# Patient Record
Sex: Male | Born: 1985 | Race: White | Hispanic: No | Marital: Married | Smoking: Former smoker
Health system: Southern US, Community
[De-identification: ages and names within clinical notes are randomized; demographics above are authoritative.]

## PROBLEM LIST (undated history)

## (undated) DIAGNOSIS — M9918 Subluxation complex (vertebral) of rib cage: Secondary | ICD-10-CM

## (undated) DIAGNOSIS — F419 Anxiety disorder, unspecified: Secondary | ICD-10-CM

## (undated) HISTORY — DX: Subluxation complex (vertebral) of rib cage: M99.18

## (undated) HISTORY — DX: Anxiety disorder, unspecified: F41.9

## (undated) HISTORY — PX: PSEUDOANEURYSM REPAIR: SHX2272

## (undated) HISTORY — PX: WISDOM TOOTH EXTRACTION: SHX21

---

## 2003-12-08 ENCOUNTER — Encounter: Admission: RE | Admit: 2003-12-08 | Discharge: 2003-12-08 | Payer: Self-pay | Admitting: Sports Medicine

## 2004-02-26 ENCOUNTER — Ambulatory Visit: Payer: Self-pay | Admitting: Dermatology

## 2009-05-17 ENCOUNTER — Emergency Department: Payer: Self-pay | Admitting: Emergency Medicine

## 2010-10-15 ENCOUNTER — Other Ambulatory Visit: Payer: Self-pay | Admitting: Internal Medicine

## 2010-10-15 MED ORDER — VALACYCLOVIR HCL 500 MG PO TABS: 500.0000 mg | ORAL_TABLET | Freq: Every day | ORAL | Status: AC

## 2010-10-19 ENCOUNTER — Other Ambulatory Visit: Payer: Self-pay | Admitting: Internal Medicine

## 2013-02-28 DIAGNOSIS — G2589 Other specified extrapyramidal and movement disorders: Secondary | ICD-10-CM | POA: Insufficient documentation

## 2016-10-31 DIAGNOSIS — B002 Herpesviral gingivostomatitis and pharyngotonsillitis: Secondary | ICD-10-CM | POA: Insufficient documentation

## 2016-10-31 DIAGNOSIS — Z6831 Body mass index (BMI) 31.0-31.9, adult: Secondary | ICD-10-CM | POA: Insufficient documentation

## 2019-10-14 ENCOUNTER — Other Ambulatory Visit: Payer: Self-pay

## 2019-10-14 ENCOUNTER — Other Ambulatory Visit: Payer: Self-pay | Admitting: *Deleted

## 2019-10-14 DIAGNOSIS — Z20822 Contact with and (suspected) exposure to covid-19: Secondary | ICD-10-CM

## 2019-10-16 LAB — NOVEL CORONAVIRUS, NAA: SARS-CoV-2, NAA: NOT DETECTED

## 2019-10-16 LAB — SARS-COV-2, NAA 2 DAY TAT

## 2020-03-16 DIAGNOSIS — M13812 Other specified arthritis, left shoulder: Secondary | ICD-10-CM | POA: Diagnosis not present

## 2020-05-12 DIAGNOSIS — M13812 Other specified arthritis, left shoulder: Secondary | ICD-10-CM | POA: Diagnosis not present

## 2020-10-13 ENCOUNTER — Encounter: Payer: Self-pay | Admitting: Family Medicine

## 2020-10-13 ENCOUNTER — Ambulatory Visit (INDEPENDENT_AMBULATORY_CARE_PROVIDER_SITE_OTHER): Admitting: Family Medicine

## 2020-10-13 ENCOUNTER — Other Ambulatory Visit: Payer: Self-pay

## 2020-10-13 VITALS — BP 124/76 | HR 54 | Temp 98.1°F | Ht 69.0 in | Wt 218.0 lb

## 2020-10-13 DIAGNOSIS — M25512 Pain in left shoulder: Secondary | ICD-10-CM

## 2020-10-13 DIAGNOSIS — Z833 Family history of diabetes mellitus: Secondary | ICD-10-CM

## 2020-10-13 DIAGNOSIS — Z13220 Encounter for screening for lipoid disorders: Secondary | ICD-10-CM

## 2020-10-13 DIAGNOSIS — B002 Herpesviral gingivostomatitis and pharyngotonsillitis: Secondary | ICD-10-CM

## 2020-10-13 DIAGNOSIS — M7542 Impingement syndrome of left shoulder: Secondary | ICD-10-CM | POA: Insufficient documentation

## 2020-10-13 DIAGNOSIS — Z0000 Encounter for general adult medical examination without abnormal findings: Secondary | ICD-10-CM | POA: Diagnosis not present

## 2020-10-13 DIAGNOSIS — M7522 Bicipital tendinitis, left shoulder: Secondary | ICD-10-CM | POA: Insufficient documentation

## 2020-10-13 DIAGNOSIS — M25579 Pain in unspecified ankle and joints of unspecified foot: Secondary | ICD-10-CM | POA: Insufficient documentation

## 2020-10-13 DIAGNOSIS — R7989 Other specified abnormal findings of blood chemistry: Secondary | ICD-10-CM | POA: Diagnosis not present

## 2020-10-13 DIAGNOSIS — M19072 Primary osteoarthritis, left ankle and foot: Secondary | ICD-10-CM | POA: Insufficient documentation

## 2020-10-13 DIAGNOSIS — R2240 Localized swelling, mass and lump, unspecified lower limb: Secondary | ICD-10-CM | POA: Insufficient documentation

## 2020-10-13 MED ORDER — VALACYCLOVIR HCL 500 MG PO TABS: 500.0000 mg | ORAL_TABLET | Freq: Every day | ORAL | 0 refills | Status: DC

## 2020-10-13 NOTE — Assessment & Plan Note (Signed)
Chronic and stable condition, 90-day refill provided today.

## 2020-10-13 NOTE — Assessment & Plan Note (Signed)
Is a chronic issue with previous treatments inclusive of corticosteroid injections x2, last of which was in the April 2022 timeframe.  He has had positive response from these in the past.  He is in an avid weightlifter and noticing primarily anterolateral shoulder pain, examination consistent with primarily [facility] joint arthralgia, secondarily biceps tendinitis, and subacromial impingement with maintained rotator cuff strength noted.  We discussed various treatment strategies inclusive of topical versus oral NSAIDs, continued by mechanic training, chiropractor care, corticosteroid injections, platelet rich plasma injections.  He is interested in repeating corticosteroid injections, I have advised him to schedule a follow-up accordingly for planned procedure.

## 2020-10-13 NOTE — Assessment & Plan Note (Signed)
Annual examination complete, risk stratification labs obtained, anticipatory guidance provided, we will follow results once obtained.

## 2020-10-13 NOTE — Progress Notes (Signed)
Annual Physical Exam Visit  Patient Information:  Patient ID: Steve Simmons, male DOB: 07/15/1985 Age: 35 y.o. MRN: [id number]   Subjective:   CC: Annual Physical Exam  HPI:  Steve Simmons is here for their annual physical.  I reviewed the past medical history, family history, social history, surgical history, and allergies today and changes were made as necessary.  Please see the problem list section below for additional details.  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   Past Surgical History: Past Surgical History:  Procedure Laterality Date   PSEUDOANEURYSM REPAIR Right    temporal artery   WISDOM TOOTH EXTRACTION Bilateral    Family History: Family History  Problem Relation Age of Onset   Hypertension Mother    Diabetes Father    Prostate cancer Father    Obesity Brother    Diabetes Maternal Grandmother    Heart attack Maternal Grandfather    Allergies: No Known Allergies Health Maintenance: Health Maintenance  Topic Date Due   HIV Screening  Never done   Hepatitis C Screening  Never done   COVID-19 Vaccine (2 - Booster for Janssen series) 09/04/2019   INFLUENZA VACCINE  Never done   TETANUS/TDAP  03/12/2028   Pneumococcal Vaccine 75-71 Years old  Aged Out   HPV VACCINES  Aged Out    HM Colonoscopy     This patient has no relevant Health Maintenance data.      Medications: Current Outpatient Medications on File Prior to Visit  Medication Sig Dispense Refill   Multiple Vitamin (MULTIVITAMIN) tablet Take 1 tablet by mouth daily.     No current facility-administered medications on file prior to visit.    Review of Systems: No headache, visual changes, nausea, vomiting, diarrhea, constipation, dizziness, abdominal pain, skin rash, fevers, chills, night sweats, swollen lymph nodes, weight loss, chest pain, body aches, +joint pain, muscle aches, shortness of breath, mood  changes, visual or auditory hallucinations reported.  Objective:   Vitals:   10/13/20 1553  BP: 124/76  Pulse: (!) 54  Temp: 98.1 F (36.7 C)  SpO2: 96%   Vitals:   10/13/20 1553  Weight: 218 lb (98.9 kg)  Height: 5\' 9"  (1.753 m)   Body mass index is 32.19 kg/m.  General: Well Developed, well nourished, and in no acute distress.  Neuro: Alert and oriented x3, extra-ocular muscles intact, sensation grossly intact. Cranial nerves II through XII are intact, motor, sensory, and coordinative functions are all intact. HEENT: Normocephalic, atraumatic, pupils equal round reactive to light, neck supple, no masses, no lymphadenopathy, thyroid nonpalpable. Oropharynx, nasopharynx, external ear canals are unremarkable. Skin: Warm and dry, no rashes noted.  Cardiac: Regular rate and rhythm, no murmurs rubs or gallops. No peripheral edema. Pulses symmetric. Respiratory: Clear to auscultation bilaterally. Not using accessory muscles, speaking in full sentences.  Abdominal: Soft, nontender, nondistended, positive bowel sounds, no masses, no organomegaly. Musculoskeletal: Shoulder, elbow, wrist, hip, knee, ankle stable, and with full range of motion.  Left shoulder with AC, bicipital groove, subacromial tenderness noted, equivocal impingement, negative speeds, Yergason's, equivocal cross body.   Impression and Recommendations:   The patient was counselled, risk factors were discussed, and anticipatory guidance given.  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  Subacromial impingement, left See additional assessment(s) for plan details.  Biceps tendinitis, left See additional assessment(s) for plan details.  Arthralgia of left  acromioclavicular joint Is a chronic issue with previous treatments inclusive of corticosteroid injections x2, last of which was in the April 2022 timeframe.  He has had positive response from these in the past.  He is  in an avid weightlifter and noticing primarily anterolateral shoulder pain, examination consistent with primarily [facility] joint arthralgia, secondarily biceps tendinitis, and subacromial impingement with maintained rotator cuff strength noted.  We discussed various treatment strategies inclusive of topical versus oral NSAIDs, continued by mechanic training, chiropractor care, corticosteroid injections, platelet rich plasma injections.  He is interested in repeating corticosteroid injections, I have advised him to schedule a follow-up accordingly for planned procedure.  Annual physical exam Annual examination complete, risk stratification labs obtained, anticipatory guidance provided, we will follow results once obtained.  Orders & Medications Medications:  Meds ordered this encounter  Medications   valACYclovir (VALTREX) 500 MG tablet    Sig: Take 1 tablet (500 mg total) by mouth daily.    Dispense:  90 tablet    Refill:  0   Orders Placed This Encounter  Procedures   TSH Rfx on Abnormal to Free T4   Lipid panel   CBC with Differential/Platelet   Hemoglobin A1c   VITAMIN D 25 Hydroxy (Vit-D Deficiency, Fractures)   Comprehensive metabolic panel   HIV antibody (with reflex)   Hepatitis C Antibody     Return for Patient preference time/date for follow left shoulder +/- bilateral knees .    Jerrol Banana, MD   Primary Care Sports Medicine [facility]

## 2020-10-13 NOTE — Assessment & Plan Note (Signed)
See additional assessment(s) for plan details. 

## 2020-10-13 NOTE — Patient Instructions (Addendum)
-   Obtain fasting labs with orders provided (can have water or black coffee but otherwise no food or drink x 8 hours before labs) - Review information provided - Attend eye doctor annually, dentist every 6 months, work towards or maintain 30 minutes of moderate intensity physical activity at least 5 days per week, and consume a balanced diet - Follow-up once scheduled for left shoulder (+/- both knees) - Contact us for any questions between now and then

## 2020-10-16 ENCOUNTER — Encounter: Payer: Self-pay | Admitting: Family Medicine

## 2020-10-16 ENCOUNTER — Other Ambulatory Visit: Payer: Self-pay

## 2020-10-16 ENCOUNTER — Inpatient Hospital Stay (INDEPENDENT_AMBULATORY_CARE_PROVIDER_SITE_OTHER): Admitting: Radiology

## 2020-10-16 ENCOUNTER — Ambulatory Visit (INDEPENDENT_AMBULATORY_CARE_PROVIDER_SITE_OTHER): Payer: BC Managed Care – PPO | Admitting: Family Medicine

## 2020-10-16 VITALS — BP 118/80 | HR 55 | Ht 69.0 in | Wt 217.0 lb

## 2020-10-16 DIAGNOSIS — M7522 Bicipital tendinitis, left shoulder: Secondary | ICD-10-CM

## 2020-10-16 DIAGNOSIS — M25512 Pain in left shoulder: Secondary | ICD-10-CM | POA: Diagnosis not present

## 2020-10-16 DIAGNOSIS — M25561 Pain in right knee: Secondary | ICD-10-CM

## 2020-10-16 DIAGNOSIS — Z13220 Encounter for screening for lipoid disorders: Secondary | ICD-10-CM | POA: Diagnosis not present

## 2020-10-16 DIAGNOSIS — M25562 Pain in left knee: Secondary | ICD-10-CM

## 2020-10-16 DIAGNOSIS — M7542 Impingement syndrome of left shoulder: Secondary | ICD-10-CM

## 2020-10-16 DIAGNOSIS — R7989 Other specified abnormal findings of blood chemistry: Secondary | ICD-10-CM | POA: Diagnosis not present

## 2020-10-16 DIAGNOSIS — Z0000 Encounter for general adult medical examination without abnormal findings: Secondary | ICD-10-CM | POA: Diagnosis not present

## 2020-10-16 DIAGNOSIS — Z833 Family history of diabetes mellitus: Secondary | ICD-10-CM | POA: Diagnosis not present

## 2020-10-16 MED ORDER — TRIAMCINOLONE ACETONIDE 40 MG/ML IJ SUSP: 120.0000 mg | Freq: Once | INTRAMUSCULAR | Status: DC

## 2020-10-16 MED ORDER — DICLOFENAC SODIUM 75 MG PO TBEC: 75.0000 mg | DELAYED_RELEASE_TABLET | Freq: Two times a day (BID) | ORAL | 0 refills | Status: DC

## 2020-10-16 MED ORDER — TRIAMCINOLONE ACETONIDE 40 MG/ML IJ SUSP
120.0000 mg | Freq: Once | INTRAMUSCULAR | Status: AC
  Administered 2020-10-16: 120 mg

## 2020-10-16 NOTE — Assessment & Plan Note (Signed)
See additional assessment(s) for plan details. 

## 2020-10-16 NOTE — Progress Notes (Signed)
Primary Care / Sports Medicine Office Visit  Patient Information:  Patient ID: Steve Simmons, male DOB: 08/30/1985 Age: 35 y.o. MRN: [id number]   Steve Simmons is a pleasant 35 y.o. male presenting with the following:  Chief Complaint  Patient presents with   Shoulder Pain    Left; 08/2019 injury while lifting weights; was seeing [facility] and [facility] for treatment; cortisone injections have helped in the past; right-handedness; 2/10 pain    Review of Systems pertinent details above   Patient Active Problem List   Diagnosis Date Noted   Arthralgia of both knees 10/16/2020   Mass of knee 10/13/2020   Ankle joint pain 10/13/2020   Arthralgia of left acromioclavicular joint 10/13/2020   Subacromial impingement, left 10/13/2020   Biceps tendinitis, left 10/13/2020   Annual physical exam 10/13/2020   Recurrent oral herpes simplex 10/31/2016   BMI 31.0-31.9,adult 10/31/2016   Scapular dyskinesis 02/28/2013   Past Medical History:  Diagnosis Date   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   ibuprofen (ADVIL) 200 MG tablet Take 800 mg by mouth every 6 (six) hours as needed.   Multiple Vitamin (MULTIVITAMIN) tablet Take 1 tablet by mouth daily.   valACYclovir (VALTREX) 500 MG tablet Take 1 tablet (500 mg total) by mouth daily.   [EXPIRED] triamcinolone acetonide (KENALOG-40) injection 120 mg    [DISCONTINUED] triamcinolone acetonide (KENALOG-40) injection 120 mg    No facility-administered encounter medications on file as of 10/16/2020.   Past Surgical History:  Procedure Laterality Date   PSEUDOANEURYSM REPAIR Right    temporal artery   WISDOM TOOTH EXTRACTION Bilateral     Vitals:   10/16/20 1323  BP: 118/80  Pulse: (!) 55  SpO2: 97%   Vitals:   10/16/20 1323  Weight: 217  lb (98.4 kg)  Height: 5\' 9"  (1.753 m)   Body mass index is 32.05 kg/m.     Independent interpretation of notes and tests performed by another provider:   None  Procedures performed:   Procedure:  Injection of the left acromioclavicular joint under ultrasound guidance. Ultrasound guidance utilized for out of plane approach to visualize joint space, confirmation of injectate Samsung HS60 device utilized with permanent recording / reporting. Consent obtained and verified. Skin prepped in a sterile fashion. Ethyl chloride spray for topical local analgesia.  Completed without difficulty and tolerated well. Medication: triamcinolone acetonide 40 mg/mL suspension for injection 1 mL total and 0.5 mL lidocaine 1% without epinephrine utilized for needle placement anesthetic Advised to contact for fevers/chills, erythema, induration, drainage, or persistent bleeding.  Procedure:  Injection of left biceps tendon sheath under ultrasound guidance. Ultrasound guidance utilized for out of plane approach to visualize the biceps tendon situated in the bicipital groove, hypoechoic response around the tendon noted following injection Samsung HS60 device utilized with permanent recording / reporting. Consent obtained and verified. Skin prepped in a sterile fashion. Ethyl chloride spray for topical local analgesia.  Completed without difficulty and tolerated well. Medication: triamcinolone acetonide 40 mg/mL suspension for injection 1 mL total and 2 mL lidocaine 1% without epinephrine utilized for needle placement anesthetic Advised to contact for fevers/chills, erythema, induration, drainage, or persistent bleeding.  Procedure:  Injection of left subacromial space under ultrasound guidance. Ultrasound guidance utilized for in plane approach, visualization  of supraspinatus and deltoid, response of injectate noted Samsung HS60 device utilized with permanent recording / reporting. Consent obtained and  verified. Skin prepped in a sterile fashion. Ethyl chloride spray for topical local analgesia.  Completed without difficulty and tolerated well. Medication: triamcinolone acetonide 40 mg/mL suspension for injection 1 mL total and 2 mL lidocaine 1% without epinephrine utilized for needle placement anesthetic Advised to contact for fevers/chills, erythema, induration, drainage, or persistent bleeding.   Pertinent History, Exam, Impression, and Recommendations:   Arthralgia of left acromioclavicular joint Patient presents for planned injections of the left acromioclavicular, biceps tendon sheath, and subacromial injections.  He tolerated these well and provocative testing following injections were nearly asymptomatic.  Postinjection care reviewed and have advised him to take relative rest prior to restart of full activity.  He has had physical therapy in the past and I have encouraged him to continue home-based rehab in the gym and follow-up as needed.  Corticosteroid injections can be repeated in 3 months if indicated.  Biceps tendinitis, left See additional assessment(s) for plan details.  Subacromial impingement, left See additional assessment(s) for plan details.  Arthralgia of both knees Patient with longstanding bilateral diffuse knee pain, left greater than right in the setting of high-level activity with the [employer] and weight training.  He notices most of his symptoms during leg press, squats, flexion/extension lower extremity activities.  Treatment has included relative rest.  He denies any specific trauma or injury, swelling, mechanical catching or locking, buckling, but does endorse intermittent instability of the left knee during high levels of resistance activities.  Examination today reveals no effusion bilaterally, range of motion is from 0 to 135 degrees bilaterally and is painless, resisted knee extension benign, tenderness at the left inferior medial patellar facet, bilateral  knees at the medial and lateral joint lines, no laxity with anterior/posterior drawer, valgus/varus stressing, McMurray's benign.  Patient symptoms are most consistent with overuse related arthralgia, will determine the extent of involvement with dedicated plain films of both knees.  Additionally, diclofenac twice daily as needed and provided a home-based rehab program the patient can modify.  Pending films, he may be a candidate for viscosupplementation, additional treatments and reviewed such as joint health supplementation, platelet rich plasma, etc.   Orders & Medications Meds ordered this encounter  Medications   diclofenac (VOLTAREN) 75 MG EC tablet    Sig: Take 1 tablet (75 mg total) by mouth 2 (two) times daily.    Dispense:  30 tablet    Refill:  0   DISCONTD: triamcinolone acetonide (KENALOG-40) injection 120 mg   triamcinolone acetonide (KENALOG-40) injection 120 mg   Orders Placed This Encounter  Procedures   [place] LIMITED JOINT SPACE STRUCTURES UP LEFT   DG Knee Complete 4 Views Right   DG Knee Complete 4 Views Left     Return if symptoms worsen or fail to improve.     Jerrol Banana, MD   Primary Care Sports Medicine [facility]

## 2020-10-16 NOTE — Assessment & Plan Note (Signed)
Patient presents for planned injections of the left acromioclavicular, biceps tendon sheath, and subacromial injections.  He tolerated these well and provocative testing following injections were nearly asymptomatic.  Postinjection care reviewed and have advised him to take relative rest prior to restart of full activity.  He has had physical therapy in the past and I have encouraged him to continue home-based rehab in the gym and follow-up as needed.  Corticosteroid injections can be repeated in 3 months if indicated.

## 2020-10-16 NOTE — Assessment & Plan Note (Signed)
Patient with longstanding bilateral diffuse knee pain, left greater than right in the setting of high-level activity with the [employer] and weight training.  He notices most of his symptoms during leg press, squats, flexion/extension lower extremity activities.  Treatment has included relative rest.  He denies any specific trauma or injury, swelling, mechanical catching or locking, buckling, but does endorse intermittent instability of the left knee during high levels of resistance activities.  Examination today reveals no effusion bilaterally, range of motion is from 0 to 135 degrees bilaterally and is painless, resisted knee extension benign, tenderness at the left inferior medial patellar facet, bilateral knees at the medial and lateral joint lines, no laxity with anterior/posterior drawer, valgus/varus stressing, McMurray's benign.  Patient symptoms are most consistent with overuse related arthralgia, will determine the extent of involvement with dedicated plain films of both knees.  Additionally, diclofenac twice daily as needed and provided a home-based rehab program the patient can modify.  Pending films, he may be a candidate for viscosupplementation, additional treatments and reviewed such as joint health supplementation, platelet rich plasma, etc.

## 2020-10-16 NOTE — Patient Instructions (Addendum)
You have just been given a cortisone injection to reduce pain and inflammation. After the injection you may notice immediate relief of pain as a result of the Lidocaine. It is important to rest the area of the injection for 24 to 48 hours after the injection. There is a possibility of some temporary increased discomfort and swelling for up to 72 hours until the cortisone begins to work. If you do have pain, simply rest the joint and use ice. If you can tolerate over the counter medications, you can try Tylenol, Aleve, or Advil for added relief per package instructions.  - Relative rest x2 days for upper body resisted activities as discussed - Utilize diclofenac every 12 hours on an as-needed basis for shoulder and/or knee pain - Start home exercises for both knees, advance these as symptoms allow - Obtain x-rays with order provided for the knees, our office will contact you with results and next steps - Contact us for questions and follow-up as needed

## 2020-10-17 LAB — CBC WITH DIFFERENTIAL/PLATELET
Basophils Absolute: 0.1 10*3/uL (ref 0.0–0.2)
Basos: 2 %
EOS (ABSOLUTE): 0.1 10*3/uL (ref 0.0–0.4)
Eos: 3 %
Hematocrit: 45.4 % (ref 37.5–51.0)
Hemoglobin: 15.3 g/dL (ref 13.0–17.7)
Immature Grans (Abs): 0 10*3/uL (ref 0.0–0.1)
Immature Granulocytes: 1 %
Lymphocytes Absolute: 1.5 10*3/uL (ref 0.7–3.1)
Lymphs: 41 %
MCH: 29.8 pg (ref 26.6–33.0)
MCHC: 33.7 g/dL (ref 31.5–35.7)
MCV: 88 fL (ref 79–97)
Monocytes Absolute: 0.4 10*3/uL (ref 0.1–0.9)
Monocytes: 11 %
Neutrophils Absolute: 1.5 10*3/uL (ref 1.4–7.0)
Neutrophils: 42 %
Platelets: 243 10*3/uL (ref 150–450)
RBC: 5.14 x10E6/uL (ref 4.14–5.80)
RDW: 12 % (ref 11.6–15.4)
WBC: 3.5 10*3/uL (ref 3.4–10.8)

## 2020-10-17 LAB — LIPID PANEL
Chol/HDL Ratio: 3 ratio (ref 0.0–5.0)
Cholesterol, Total: 166 mg/dL (ref 100–199)
HDL: 56 mg/dL (ref >39)
LDL Chol Calc (NIH): 98 mg/dL (ref 0–99)
Triglycerides: 58 mg/dL (ref 0–149)
VLDL Cholesterol Cal: 12 mg/dL (ref 5–40)

## 2020-10-17 LAB — COMPREHENSIVE METABOLIC PANEL
ALT: 30 IU/L (ref 0–44)
AST: 38 IU/L (ref 0–40)
Albumin/Globulin Ratio: 2 (ref 1.2–2.2)
Albumin: 4.9 g/dL (ref 4.0–5.0)
Alkaline Phosphatase: 59 IU/L (ref 44–121)
BUN/Creatinine Ratio: 20 (ref 9–20)
BUN: 20 mg/dL (ref 6–20)
Bilirubin Total: 0.5 mg/dL (ref 0.0–1.2)
CO2: 20 mmol/L (ref 20–29)
Calcium: 9.7 mg/dL (ref 8.7–10.2)
Chloride: 101 mmol/L (ref 96–106)
Creatinine, Ser: 1 mg/dL (ref 0.76–1.27)
Globulin, Total: 2.4 g/dL (ref 1.5–4.5)
Glucose: 84 mg/dL (ref 65–99)
Potassium: 5.3 mmol/L — ABNORMAL HIGH (ref 3.5–5.2)
Sodium: 139 mmol/L (ref 134–144)
Total Protein: 7.3 g/dL (ref 6.0–8.5)
eGFR: 99 mL/min/{1.73_m2} (ref >59)

## 2020-10-17 LAB — HEMOGLOBIN A1C
Est. average glucose Bld gHb Est-mCnc: 103 mg/dL
Hgb A1c MFr Bld: 5.2 % (ref 4.8–5.6)

## 2020-10-17 LAB — HEPATITIS C ANTIBODY: Hep C Virus Ab: <0.1 s/co ratio (ref 0.0–0.9)

## 2020-10-17 LAB — TSH RFX ON ABNORMAL TO FREE T4: TSH: 1.85 u[IU]/mL (ref 0.450–4.500)

## 2020-10-17 LAB — HIV ANTIBODY (ROUTINE TESTING W REFLEX): HIV Screen 4th Generation wRfx: NONREACTIVE

## 2020-10-17 LAB — VITAMIN D 25 HYDROXY (VIT D DEFICIENCY, FRACTURES): Vit D, 25-Hydroxy: 32.9 ng/mL (ref 30.0–100.0)

## 2020-12-18 ENCOUNTER — Other Ambulatory Visit: Payer: Self-pay

## 2020-12-18 ENCOUNTER — Other Ambulatory Visit: Payer: Self-pay | Admitting: Family Medicine

## 2020-12-18 ENCOUNTER — Encounter: Payer: Self-pay | Admitting: Family Medicine

## 2020-12-18 DIAGNOSIS — B002 Herpesviral gingivostomatitis and pharyngotonsillitis: Secondary | ICD-10-CM

## 2020-12-18 MED ORDER — VALACYCLOVIR HCL 500 MG PO TABS: 500.0000 mg | ORAL_TABLET | Freq: Every day | ORAL | 0 refills | Status: DC

## 2021-01-22 DIAGNOSIS — Z20822 Contact with and (suspected) exposure to covid-19: Secondary | ICD-10-CM | POA: Diagnosis not present

## 2021-01-29 ENCOUNTER — Other Ambulatory Visit: Payer: Self-pay | Admitting: Family Medicine

## 2021-01-29 DIAGNOSIS — M25512 Pain in left shoulder: Secondary | ICD-10-CM

## 2021-01-29 DIAGNOSIS — M25561 Pain in right knee: Secondary | ICD-10-CM

## 2021-01-29 DIAGNOSIS — M7542 Impingement syndrome of left shoulder: Secondary | ICD-10-CM

## 2021-01-29 DIAGNOSIS — M7522 Bicipital tendinitis, left shoulder: Secondary | ICD-10-CM

## 2021-01-29 DIAGNOSIS — B002 Herpesviral gingivostomatitis and pharyngotonsillitis: Secondary | ICD-10-CM

## 2021-01-29 DIAGNOSIS — M25562 Pain in left knee: Secondary | ICD-10-CM

## 2021-01-29 NOTE — Telephone Encounter (Signed)
Patient needs an appointment for future refills.  Please schedule.  

## 2021-01-29 NOTE — Telephone Encounter (Signed)
Lvm for patient to follow up with medication refills.

## 2021-02-01 ENCOUNTER — Encounter: Payer: Self-pay | Admitting: Family Medicine

## 2021-02-02 ENCOUNTER — Other Ambulatory Visit: Payer: Self-pay

## 2021-02-02 MED ORDER — VALACYCLOVIR HCL 500 MG PO TABS: 500.0000 mg | ORAL_TABLET | Freq: Every day | ORAL | 0 refills | Status: DC

## 2021-02-02 MED ORDER — DICLOFENAC SODIUM 75 MG PO TBEC: 75.0000 mg | DELAYED_RELEASE_TABLET | Freq: Two times a day (BID) | ORAL | 0 refills | Status: DC

## 2021-02-08 ENCOUNTER — Encounter: Payer: Self-pay | Admitting: Family Medicine

## 2021-02-08 ENCOUNTER — Ambulatory Visit
Admission: RE | Admit: 2021-02-08 | Discharge: 2021-02-08 | Disposition: A | Discharge status: Home / Self Care | Payer: BC Managed Care – PPO | Source: Ambulatory Visit | Attending: Family Medicine | Admitting: Family Medicine

## 2021-02-08 ENCOUNTER — Ambulatory Visit
Admission: RE | Admit: 2021-02-08 | Discharge: 2021-02-08 | Disposition: A | Discharge status: Home / Self Care | Payer: BC Managed Care – PPO | Attending: Family Medicine | Admitting: Family Medicine

## 2021-02-08 ENCOUNTER — Other Ambulatory Visit: Payer: Self-pay

## 2021-02-08 ENCOUNTER — Ambulatory Visit (INDEPENDENT_AMBULATORY_CARE_PROVIDER_SITE_OTHER): Payer: BC Managed Care – PPO | Admitting: Family Medicine

## 2021-02-08 VITALS — BP 124/80 | HR 66 | Ht 69.0 in | Wt 225.0 lb

## 2021-02-08 DIAGNOSIS — M7522 Bicipital tendinitis, left shoulder: Secondary | ICD-10-CM | POA: Insufficient documentation

## 2021-02-08 DIAGNOSIS — M7542 Impingement syndrome of left shoulder: Secondary | ICD-10-CM | POA: Insufficient documentation

## 2021-02-08 DIAGNOSIS — M25512 Pain in left shoulder: Secondary | ICD-10-CM | POA: Diagnosis not present

## 2021-02-08 DIAGNOSIS — F32A Depression, unspecified: Secondary | ICD-10-CM

## 2021-02-08 DIAGNOSIS — F419 Anxiety disorder, unspecified: Secondary | ICD-10-CM | POA: Diagnosis not present

## 2021-02-08 MED ORDER — DULOXETINE HCL 30 MG PO CPEP: ORAL_CAPSULE | ORAL | 0 refills | Status: DC

## 2021-02-08 NOTE — Assessment & Plan Note (Signed)
Noted progression over the past several weeks of short temper, outbursts of anger, difficulty with concentration, sleep disruption.  This has coincided with life stressors inclusive of upcoming birth of child, work-related stressors, physical pain resulting in decreased working out (his usual outlet).  Clinical concern for anxiety/depression, treatment strategies outlined and he is amenable to both pharmacologic and nonpharmacologic treatment strategies inclusive of a restart of exercise therapy, mindfulness, and we will initiate duloxetine for potential dual benefit in regards to mood and chronic pain.  He is to return for follow-up in 6 weeks.

## 2021-02-08 NOTE — Assessment & Plan Note (Signed)
Chronic issue that showed initial positive response from cortisone injection 10/2020, has noted recurrence since the 12/2020 timeframe.  Physical exam shows focal tenderness at the biceps tendon, equivocal speeds, negative Yergason's, negative O'Brien's.  X-ray of this left shoulder was ordered however I have advised diclofenac on a as needed basis and duloxetine will be initiated with goal of chronic musculoskeletal pain dose (60 mg).  We will follow-up on the symptoms in 3 months time, if symptomatic, pending x-rays and response medications, advanced imaging to be considered.

## 2021-02-08 NOTE — Assessment & Plan Note (Signed)
Chronic issue that showed excellent response to previous injection in 10/2020, symptoms recurred roughly 3 months later to a lesser degree.  He finds it this interferes with overhead activity, resisted activities, and even running.  Examination shows focal tenderness at the [facility] joint, interval improved, positive cross body.  Plan for dedicated x-rays and we have discussed additional injections, PRP, and he will utilize diclofenac on a as needed basis over the interim.

## 2021-02-08 NOTE — Patient Instructions (Addendum)
- Obtain x-ray - Start duloxetine 30 mg daily x2 weeks - After 2 weeks increase duloxetine to 60 mg until follow-up - Can dose previously prescribed diclofenac on an as-needed basis - Return for follow-up in 6 weeks  Mindfulness-Based Stress Reduction Mindfulness-based stress reduction (MBSR) is a program that helps you learn to practice mindfulness. Mindfulness is the practice of intentionally paying attention to the present moment. MBSR focuses on developing self-awareness, which allows you to respond to life stress without judgment or negative emotions. It can be learned and practiced through techniques such as education, breathing exercises, meditation, and yoga. MBSR includes several mindfulness techniques in one program. MBSR works best when you understand the treatment, are willing to try new things, and can commit to spending time practicing what you learn.  What are the Benefits of MBSR? MBSR has been shown to have many benefits, which include helping you to: Reduce negative emotions, such as depression and anxiety. Improve memory and focus. Change how you sense and approach pain. Boost your body's ability to fight infections. Help you connect better with other people. Improve your sense of well-being. Follow These Instructions at Home:  Find a local in-person or online MBSR program. Set aside some time regularly for mindfulness practice. Find a mindfulness practice that works best for you. This may include one or more of the following: Meditation. Meditation involves focusing your mind on a certain thought or activity. Breathing awareness exercises. These help you to stay present by focusing on your breath. Body scan. For this practice, you lie down and pay attention to each part of your body from head to toe. You can identify tension and soreness and intentionally relax parts of your body. Yoga. Yoga involves stretching and breathing, and it can improve your ability to move and be  flexible. It can also provide an experience of testing your body's limits, which can help you release stress. Mindful eating. This way of eating involves focusing on the taste, texture, color, and smell of each bite of food. Because this slows down eating and helps you feel full sooner, it can be an important part of a weight-loss plan. Find a podcast or recording that provides guidance for breathing awareness, body scan, or meditation exercises. You can listen to these any time when you have a free moment to rest without distractions. Follow your treatment plan as told by your health care provider. This may include taking regular medicines and making changes to your diet or lifestyle as recommended. How to Practice Mindfulness: To do a basic awareness exercise: Find a comfortable place to sit or lay down. Pay attention to the present moment. Observe your thoughts, feelings, and surroundings just as they are. Avoid placing judgment on yourself, your feelings, or your surroundings. Make note of any judgment that comes up, and let it go. Your mind may wander, and that is okay. Make note of when your thoughts drift, and return your attention to the present moment. To do basic mindfulness meditation: Find a comfortable place to sit or lay down. This may include a stable chair, your bed, or a firm floor cushion. Sit upright with your back straight. Let your arms fall next to your side with your hands resting on your legs. If sitting in a chair, rest your feet flat on the floor. If sitting on a cushion, cross your legs in front of you. If laying down, let your limbs fully relax. Keep your head in a neutral position with your chin dropped  slightly. Relax your jaw and rest the tip of your tongue on the roof of your mouth. Drop your gaze to the floor. You can close your eyes if you like. Breathe normally and pay attention to your breath. Feel the air moving in and out of your nose. Feel your belly expanding  and relaxing with each breath. Your mind may wander, and that is okay. Make note of when your thoughts drift, and return your attention to your breath. Avoid placing judgment on yourself, your feelings, or your surroundings. Make note of any judgment or feelings that come up, let them go, and bring your attention back to your breath. When you are ready, lift your gaze or open your eyes. Pay attention to how your body feels after the meditation.

## 2021-02-08 NOTE — Progress Notes (Signed)
Primary Care / Sports Medicine Office Visit  Patient Information:  Patient ID: Steve Simmons, male DOB: 1985/08/13 Age: 36 y.o. MRN: [id number]   Steve Simmons is a pleasant 36 y.o. male presenting with the following:  Chief Complaint  Patient presents with   Arthralgia of left acromioclavicular joint    Injection 10/16/20, pain resolved until 12/2020; still limiting weight-lifting exercises; right-handedness;  4/10 pain   Biceps tendinitis, left    Stable    Patient Active Problem List   Diagnosis Date Noted   Anxiety and depression 02/08/2021   Arthralgia of both knees 10/16/2020   Mass of knee 10/13/2020   Ankle joint pain 10/13/2020   Arthralgia of left acromioclavicular joint 10/13/2020   Subacromial impingement, left 10/13/2020   Biceps tendinitis, left 10/13/2020   Annual physical exam 10/13/2020   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  No results found.   Independent interpretation of notes and tests performed by another provider:   None  Procedures performed:   None  Pertinent History, Exam, Impression, and Recommendations:   Anxiety and depression Noted progression over the past several weeks of short temper, outbursts of anger, difficulty with concentration, sleep disruption.  This has coincided with life stressors inclusive of upcoming birth of child, work-related stressors, physical pain resulting in decreased working out (his usual outlet).  Clinical concern for anxiety/depression, treatment strategies outlined and he is amenable to both pharmacologic and nonpharmacologic treatment strategies inclusive of a restart of exercise therapy, mindfulness, and we will initiate duloxetine for potential dual  benefit in regards to mood and chronic pain.  He is to return for follow-up in 6 weeks.  Arthralgia of left acromioclavicular joint Chronic issue that showed excellent response to previous injection in 10/2020, symptoms recurred roughly 3 months later to a lesser degree.  He finds it this interferes with overhead activity, resisted activities, and even running.  Examination shows focal tenderness at the [facility] joint, interval improved, positive cross body.  Plan for dedicated x-rays and we have discussed additional injections, PRP, and he will utilize diclofenac on a as needed basis over the interim.  Biceps tendinitis, left Chronic issue that showed initial positive response from cortisone injection 10/2020, has noted recurrence since the 12/2020 timeframe.  Physical exam shows focal tenderness at the biceps tendon, equivocal speeds, negative Yergason's, negative O'Brien's.  X-ray of this left shoulder was ordered however I have advised diclofenac on a as needed basis and duloxetine will be initiated with goal of chronic musculoskeletal pain dose (60 mg).  We will follow-up on the symptoms in 3 months time, if symptomatic, pending x-rays and response medications, advanced imaging to be considered.  Subacromial impingement, left Chronic issue that has been stable/improved, physical examination findings today reveals negative Neer's, negative Hawkins, rotator cuff testing is benign.  We will follow-up this issue on an as-needed basis.   Orders & Medications Meds ordered this encounter  Medications   DULoxetine (CYMBALTA) 30 MG capsule    Sig: Take 1 capsule (30 mg total) by mouth every evening for 14 days, THEN 2 capsules (60 mg total) every evening for 16 days.    Dispense:  46 capsule  Refill:  0   Orders Placed This Encounter  Procedures   DG Shoulder Left     Return in about 6 weeks (around 03/22/2021).     Jerrol Banana, MD   Primary Care Sports Medicine [facility]

## 2021-02-08 NOTE — Assessment & Plan Note (Signed)
Chronic issue that has been stable/improved, physical examination findings today reveals negative Neer's, negative Hawkins, rotator cuff testing is benign.  We will follow-up this issue on an as-needed basis.

## 2021-03-13 ENCOUNTER — Encounter: Payer: Self-pay | Admitting: Family Medicine

## 2021-03-15 NOTE — Telephone Encounter (Signed)
Please advise.  Last injection 10/16/2020.

## 2021-03-22 ENCOUNTER — Inpatient Hospital Stay (INDEPENDENT_AMBULATORY_CARE_PROVIDER_SITE_OTHER): Payer: BC Managed Care – PPO | Admitting: Radiology

## 2021-03-22 ENCOUNTER — Encounter: Payer: Self-pay | Admitting: Family Medicine

## 2021-03-22 ENCOUNTER — Other Ambulatory Visit: Payer: Self-pay

## 2021-03-22 ENCOUNTER — Ambulatory Visit (INDEPENDENT_AMBULATORY_CARE_PROVIDER_SITE_OTHER): Payer: BC Managed Care – PPO | Admitting: Family Medicine

## 2021-03-22 VITALS — BP 126/82 | HR 63 | Ht 69.0 in | Wt 227.0 lb

## 2021-03-22 DIAGNOSIS — F32A Depression, unspecified: Secondary | ICD-10-CM

## 2021-03-22 DIAGNOSIS — F419 Anxiety disorder, unspecified: Secondary | ICD-10-CM | POA: Diagnosis not present

## 2021-03-22 DIAGNOSIS — M25512 Pain in left shoulder: Secondary | ICD-10-CM | POA: Diagnosis not present

## 2021-03-22 MED ORDER — TRIAMCINOLONE ACETONIDE 40 MG/ML IJ SUSP
40.0000 mg | Freq: Once | INTRAMUSCULAR | Status: AC
  Administered 2021-03-22: 40 mg

## 2021-03-22 MED ORDER — VENLAFAXINE HCL ER 37.5 MG PO CP24: 37.5000 mg | ORAL_CAPSULE | Freq: Every day | ORAL | 1 refills | Status: DC

## 2021-03-22 NOTE — Progress Notes (Signed)
°  ° °  Primary Care / Sports Medicine Office Visit  Patient Information:  Patient ID: Steve Simmons, male DOB: [birth year] Age: 36 y.o. MRN: [id number]   [name] is a pleasant 36 y.o. male presenting with the following:  Chief Complaint  Patient presents with   Shoulder Pain    Left    Depression   Anxiety    Vitals:   03/22/21 1551  BP: 126/82  Pulse: 63  SpO2: 98%   Vitals:   03/22/21 1551  Weight: 227 lb (103 kg)  Height: 5\' 9"  (1.753 m)   Body mass index is 33.52 kg/m.  No results found.   Independent interpretation of notes and tests performed by another provider:   None  Procedures performed:   Procedure:  Injection of left AC joint under ultrasound guidance. Ultrasound guidance utilized to visualize left [facility] joint, questionable cortical roughening consistent with mild degenerative change and mild effusion noted Samsung HS60 device utilized with permanent recording / reporting. Verbal informed consent obtained and verified. Skin prepped in a sterile fashion. Ethyl chloride for topical local analgesia.  Completed without difficulty and tolerated well. Medication: triamcinolone acetonide 40 mg/mL suspension for injection 1 mL total and 1 mL lidocaine 1% without epinephrine utilized for needle placement anesthetic Advised to contact for fevers/chills, erythema, induration, drainage, or persistent bleeding.  Pertinent History, Exam, Impression, and Recommendations:   Arthralgia of left acromioclavicular joint Patient presents for follow-up to left [facility] joint chronic pain with intermittent exacerbations.  He had done well with previous ultrasound-guided corticosteroid injection from the 10/2020 timeframe, when recurrence was noted roughly 3 months later, we did attempt duloxetine which did help his comorbid anxiety but did not help with pain.  Over the interim we have had electronic communication regarding the possible utility of PRP, we have had x-rays, and given  the nature of PRP from a timeline to efficacy standpoint, he is opted to proceed alternatively with corticosteroid injection.  This was performed today under ultrasound guidance, he tolerated this well, post care reviewed, and diclofenac was written for as needed usage until symptoms respond to cortisone.  Chronic condition, symptomatic, Rx management  Anxiety and depression Issue that has been noted in the past, last evaluated 02/08/2021 where duloxetine was initiated, this did help somewhat with his anxiety, but was chosen for dual action.  He did not notice significant benefit from a pain standpoint.  Given the recent significant life stressors (birth of new child) we discussed pharmacotherapy to treat anxiety/depression, we will proceed with venlafaxine 37.5 mg daily and he will contact us in 6 weeks for status update.  If indicated, further titration to be considered.  Rx management   Orders & Medications Meds ordered this encounter  Medications   venlafaxine XR (EFFEXOR XR) 37.5 MG 24 hr capsule    Sig: Take 1 capsule (37.5 mg total) by mouth daily with breakfast.    Dispense:  30 capsule    Refill:  1   triamcinolone acetonide (KENALOG-40) injection 40 mg   Orders Placed This Encounter  Procedures   [place] LIMITED JOINT SPACE STRUCTURES UP LEFT     Return if symptoms worsen or fail to improve.     Montel Culver, MD   Primary Care Sports Medicine [place]

## 2021-03-22 NOTE — Assessment & Plan Note (Signed)
Patient presents for follow-up to left [facility] joint chronic pain with intermittent exacerbations.  He had done well with previous ultrasound-guided corticosteroid injection from the 10/2020 timeframe, when recurrence was noted roughly 3 months later, we did attempt duloxetine which did help his comorbid anxiety but did not help with pain.  Over the interim we have had electronic communication regarding the possible utility of PRP, we have had x-rays, and given the nature of PRP from a timeline to efficacy standpoint, he is opted to proceed alternatively with corticosteroid injection.  This was performed today under ultrasound guidance, he tolerated this well, post care reviewed, and diclofenac was written for as needed usage until symptoms respond to cortisone.  Chronic condition, symptomatic, Rx management

## 2021-03-22 NOTE — Assessment & Plan Note (Signed)
Issue that has been noted in the past, last evaluated 02/08/2021 where duloxetine was initiated, this did help somewhat with his anxiety, but was chosen for dual action.  He did not notice significant benefit from a pain standpoint.  Given the recent significant life stressors (birth of new child) we discussed pharmacotherapy to treat anxiety/depression, we will proceed with venlafaxine 37.5 mg daily and he will contact us in 6 weeks for status update.  If indicated, further titration to be considered.  Rx management

## 2021-03-22 NOTE — Patient Instructions (Signed)
You have just been given a cortisone injection to reduce pain and inflammation. After the injection you may notice immediate relief of pain as a result of the Lidocaine. It is important to rest the area of the injection for 24 to 48 hours after the injection. There is a possibility of some temporary increased discomfort and swelling for up to 72 hours until the cortisone begins to work. If you do have pain, simply rest the joint and use ice. If you can tolerate over the counter medications, you can try Tylenol, Aleve, or Advil for added relief per package instructions. - Start Effexor daily - Can use Rx anti-inflammatories on as-needed basis until symptoms respond to cortisone - Contact us at the 6-week mark to provide a status update or for any questions between now and then

## 2021-04-09 ENCOUNTER — Other Ambulatory Visit: Payer: Self-pay | Admitting: Family Medicine

## 2021-04-09 ENCOUNTER — Other Ambulatory Visit: Payer: Self-pay

## 2021-04-09 DIAGNOSIS — M7542 Impingement syndrome of left shoulder: Secondary | ICD-10-CM

## 2021-04-09 DIAGNOSIS — M25562 Pain in left knee: Secondary | ICD-10-CM

## 2021-04-09 DIAGNOSIS — M25561 Pain in right knee: Secondary | ICD-10-CM

## 2021-04-09 DIAGNOSIS — M7522 Bicipital tendinitis, left shoulder: Secondary | ICD-10-CM

## 2021-04-09 DIAGNOSIS — M25512 Pain in left shoulder: Secondary | ICD-10-CM

## 2021-04-09 DIAGNOSIS — B002 Herpesviral gingivostomatitis and pharyngotonsillitis: Secondary | ICD-10-CM

## 2021-04-09 MED ORDER — DICLOFENAC SODIUM 75 MG PO TBEC
75.0000 mg | DELAYED_RELEASE_TABLET | Freq: Two times a day (BID) | ORAL | 0 refills | Status: DC
  Filled 2021-04-09: qty 30, 15d supply, fill #0

## 2021-04-09 MED ORDER — DICLOFENAC SODIUM 75 MG PO TBEC: 75.0000 mg | DELAYED_RELEASE_TABLET | Freq: Two times a day (BID) | ORAL | 0 refills | Status: DC

## 2021-04-09 MED ORDER — VALACYCLOVIR HCL 500 MG PO TABS
500.0000 mg | ORAL_TABLET | Freq: Every day | ORAL | 0 refills | Status: DC
  Filled 2021-04-09: qty 90, 90d supply, fill #0

## 2021-04-09 MED ORDER — VALACYCLOVIR HCL 500 MG PO TABS: 500.0000 mg | ORAL_TABLET | Freq: Every day | ORAL | 0 refills | Status: DC

## 2021-04-12 ENCOUNTER — Other Ambulatory Visit: Payer: Self-pay

## 2021-05-12 ENCOUNTER — Other Ambulatory Visit: Payer: Self-pay | Admitting: Family Medicine

## 2021-05-12 DIAGNOSIS — F32A Depression, unspecified: Secondary | ICD-10-CM

## 2021-05-12 NOTE — Telephone Encounter (Signed)
Requested medication (s) are due for refill today: yes ? ?Requested medication (s) are on the active medication list: yes ? ?Last refill:  03/22/21 #30 with 1 RF ? ?Future visit scheduled: no ? ?Notes to clinic:  Pt was supposed to call in 6  weeks from appt on 03/22/21 to evaluate how this was working and if it needed to be titrated more. I do not see that discussion, please assess. ? ? ?  ? ?Requested Prescriptions  ?Pending Prescriptions Disp Refills  ? venlafaxine XR (EFFEXOR-XR) 37.5 MG 24 hr capsule [Pharmacy Med Name: VENLAFAXINE HCL ER 37.5 MG CAP] 30 capsule 1  ?  Sig: TAKE 1 CAPSULE BY MOUTH EVERY DAY WITH BREAKFAST  ?  ? Psychiatry: Antidepressants - SNRI - desvenlafaxine & venlafaxine Failed - 05/12/2021  2:07 AM  ?  ?  Failed - Lipid Panel in normal range within the last 12 months  ?  Cholesterol, Total  ?Date Value Ref Range Status  ?10/16/2020 166 100 - 199 mg/dL Final  ? ?LDL Chol Calc (NIH)  ?Date Value Ref Range Status  ?10/16/2020 98 0 - 99 mg/dL Final  ? ?HDL  ?Date Value Ref Range Status  ?10/16/2020 56 >39 mg/dL Final  ? ?Triglycerides  ?Date Value Ref Range Status  ?10/16/2020 58 0 - 149 mg/dL Final  ? ?  ?  ?  Passed - Cr in normal range and within 360 days  ?  Creatinine, Ser  ?Date Value Ref Range Status  ?10/16/2020 1.00 0.76 - 1.27 mg/dL Final  ?  ?  ?  ?  Passed - Completed PHQ-2 or PHQ-9 in the last 360 days  ?  ?  Passed - Last BP in normal range  ?  BP Readings from Last 1 Encounters:  ?03/22/21 126/82  ?  ?  ?  ?  Passed - Valid encounter within last 6 months  ?  Recent Outpatient Visits   ? ?      ? 1 month ago Arthralgia of left acromioclavicular joint  ? [facility] Jerrol Banana, MD  ? 3 months ago Anxiety and depression  ? [facility] Jerrol Banana, MD  ? 6 months ago Arthralgia of left acromioclavicular joint  ? [facility] Jerrol Banana, MD  ? 7 months ago Recurrent oral herpes simplex  ? [facility] Jerrol Banana, MD  ? ?   ?  ? ?  ?  ?  ? ? ?

## 2021-05-16 ENCOUNTER — Encounter: Payer: Self-pay | Admitting: Family Medicine

## 2021-06-07 ENCOUNTER — Other Ambulatory Visit: Payer: Self-pay | Admitting: Family Medicine

## 2021-06-07 DIAGNOSIS — M7542 Impingement syndrome of left shoulder: Secondary | ICD-10-CM

## 2021-06-07 DIAGNOSIS — B002 Herpesviral gingivostomatitis and pharyngotonsillitis: Secondary | ICD-10-CM

## 2021-06-07 DIAGNOSIS — M25512 Pain in left shoulder: Secondary | ICD-10-CM

## 2021-06-07 DIAGNOSIS — M25562 Pain in left knee: Secondary | ICD-10-CM

## 2021-06-07 DIAGNOSIS — M7522 Bicipital tendinitis, left shoulder: Secondary | ICD-10-CM

## 2021-06-07 MED ORDER — DICLOFENAC SODIUM 75 MG PO TBEC: 75.0000 mg | DELAYED_RELEASE_TABLET | Freq: Two times a day (BID) | ORAL | 0 refills | Status: DC

## 2021-06-07 MED ORDER — VALACYCLOVIR HCL 500 MG PO TABS: 500.0000 mg | ORAL_TABLET | Freq: Every day | ORAL | 0 refills | Status: DC

## 2021-06-07 NOTE — Telephone Encounter (Signed)
Ok to refill 

## 2021-08-06 ENCOUNTER — Encounter: Payer: Self-pay | Admitting: Family Medicine

## 2021-08-06 ENCOUNTER — Other Ambulatory Visit: Payer: Self-pay | Admitting: Family Medicine

## 2021-08-06 DIAGNOSIS — M7522 Bicipital tendinitis, left shoulder: Secondary | ICD-10-CM

## 2021-08-06 DIAGNOSIS — M25562 Pain in left knee: Secondary | ICD-10-CM

## 2021-08-06 DIAGNOSIS — M7542 Impingement syndrome of left shoulder: Secondary | ICD-10-CM

## 2021-08-06 DIAGNOSIS — M25512 Pain in left shoulder: Secondary | ICD-10-CM

## 2021-08-06 DIAGNOSIS — B002 Herpesviral gingivostomatitis and pharyngotonsillitis: Secondary | ICD-10-CM

## 2021-08-06 MED ORDER — VALACYCLOVIR HCL 500 MG PO TABS: 500.0000 mg | ORAL_TABLET | Freq: Every day | ORAL | 0 refills | Status: DC

## 2021-10-26 ENCOUNTER — Other Ambulatory Visit: Payer: Self-pay | Admitting: Family Medicine

## 2021-10-26 DIAGNOSIS — B002 Herpesviral gingivostomatitis and pharyngotonsillitis: Secondary | ICD-10-CM

## 2021-10-26 NOTE — Telephone Encounter (Signed)
Requested Prescriptions  Pending Prescriptions Disp Refills  . valACYclovir (VALTREX) 500 MG tablet [Pharmacy Med Name: VALACYCLOVIR HCL 500 MG TABLET] 90 tablet 0    Sig: TAKE 1 TABLET (500 MG TOTAL) BY MOUTH DAILY.     Antimicrobials:  Antiviral Agents - Anti-Herpetic Passed - 10/26/2021  2:00 PM      Passed - Valid encounter within last 12 months    Recent Outpatient Visits          7 months ago Arthralgia of left acromioclavicular joint   [place] [facility] at [place], Earley Abide, MD   8 months ago Anxiety and depression   [place] [facility] at [place], Earley Abide, MD   1 year ago Arthralgia of left acromioclavicular joint   [place] [facility] at [place], Earley Abide, MD   1 year ago Recurrent oral herpes simplex   [facility] at [facility], Earley Abide, MD

## 2022-01-12 ENCOUNTER — Other Ambulatory Visit: Payer: Self-pay | Admitting: Family Medicine

## 2022-01-12 DIAGNOSIS — B002 Herpesviral gingivostomatitis and pharyngotonsillitis: Secondary | ICD-10-CM

## 2022-01-13 NOTE — Telephone Encounter (Signed)
Requested Prescriptions  Pending Prescriptions Disp Refills   valACYclovir (VALTREX) 500 MG tablet [Pharmacy Med Name: VALACYCLOVIR HCL 500 MG TABLET] 90 tablet 0    Sig: TAKE 1 TABLET (500 MG TOTAL) BY MOUTH DAILY.     Antimicrobials:  Antiviral Agents - Anti-Herpetic Passed - 01/12/2022  8:39 PM      Passed - Valid encounter within last 12 months    Recent Outpatient Visits           9 months ago Arthralgia of left acromioclavicular joint   [place] [facility] at [facility], Ocie Bob, MD   11 months ago Anxiety and depression   [place] [facility] at [facility], Ocie Bob, MD   1 year ago Arthralgia of left acromioclavicular joint   [place] [facility] at [facility], Ocie Bob, MD   1 year ago Recurrent oral herpes simplex   [facility] and [facility] Medicine at [facility], Ocie Bob, MD

## 2022-01-18 ENCOUNTER — Encounter: Payer: Self-pay | Admitting: Family Medicine

## 2022-01-18 ENCOUNTER — Other Ambulatory Visit: Payer: Self-pay | Admitting: Family Medicine

## 2022-01-18 MED ORDER — SERTRALINE HCL 25 MG PO TABS: 25.0000 mg | ORAL_TABLET | Freq: Every day | ORAL | 0 refills | Status: DC

## 2022-01-18 NOTE — Telephone Encounter (Signed)
Please advise 

## 2022-03-16 ENCOUNTER — Other Ambulatory Visit: Payer: Self-pay | Admitting: Family Medicine

## 2022-03-16 NOTE — Telephone Encounter (Signed)
Requested Prescriptions  Pending Prescriptions Disp Refills   sertraline (ZOLOFT) 25 MG tablet [Pharmacy Med Name: SERTRALINE HCL 25 MG TABLET] 60 tablet 0    Sig: TAKE 1 TABLET (25 MG TOTAL) BY MOUTH DAILY.     Psychiatry:  Antidepressants - SSRI - sertraline Failed - 03/16/2022  2:36 AM      Failed - AST in normal range and within 360 days    AST  Date Value Ref Range Status  10/16/2020 38 0 - 40 IU/L Final         Failed - ALT in normal range and within 360 days    ALT  Date Value Ref Range Status  10/16/2020 30 0 - 44 IU/L Final         Failed - Valid encounter within last 6 months    Recent Outpatient Visits           11 months ago Arthralgia of left acromioclavicular joint   [place] [facility] at [place], Earley Abide, MD   1 year ago Anxiety and depression   [place] at [place], Earley Abide, MD   1 year ago Arthralgia of left acromioclavicular joint   [place] at [place], Earley Abide, MD   1 year ago Recurrent oral herpes simplex   [facility] Primary [place] at [place], MD       Future Appointments             In 5 days Zigmund Daniel, Earley Abide, MD [place] at [facility], [place] - Completed PHQ-2 or PHQ-9 in the last 360 days

## 2022-03-21 ENCOUNTER — Ambulatory Visit
Admission: RE | Admit: 2022-03-21 | Discharge: 2022-03-21 | Disposition: A | Discharge status: Home / Self Care | Payer: BC Managed Care – PPO | Source: Ambulatory Visit | Attending: Family Medicine | Admitting: Family Medicine

## 2022-03-21 ENCOUNTER — Ambulatory Visit
Admission: RE | Admit: 2022-03-21 | Discharge: 2022-03-21 | Disposition: A | Discharge status: Home / Self Care | Payer: BC Managed Care – PPO | Attending: Family Medicine | Admitting: Family Medicine

## 2022-03-21 ENCOUNTER — Encounter: Payer: Self-pay | Admitting: Family Medicine

## 2022-03-21 ENCOUNTER — Ambulatory Visit: Payer: BC Managed Care – PPO | Admitting: Family Medicine

## 2022-03-21 VITALS — BP 120/82 | HR 66 | Ht 69.0 in | Wt 228.0 lb

## 2022-03-21 DIAGNOSIS — M25512 Pain in left shoulder: Secondary | ICD-10-CM

## 2022-03-21 DIAGNOSIS — M25572 Pain in left ankle and joints of left foot: Secondary | ICD-10-CM

## 2022-03-21 DIAGNOSIS — F419 Anxiety disorder, unspecified: Secondary | ICD-10-CM | POA: Diagnosis not present

## 2022-03-21 DIAGNOSIS — G8929 Other chronic pain: Secondary | ICD-10-CM

## 2022-03-21 DIAGNOSIS — M25772 Osteophyte, left ankle: Secondary | ICD-10-CM | POA: Diagnosis not present

## 2022-03-21 DIAGNOSIS — M7732 Calcaneal spur, left foot: Secondary | ICD-10-CM | POA: Diagnosis not present

## 2022-03-21 DIAGNOSIS — F32A Depression, unspecified: Secondary | ICD-10-CM

## 2022-03-21 DIAGNOSIS — M19072 Primary osteoarthritis, left ankle and foot: Secondary | ICD-10-CM | POA: Diagnosis not present

## 2022-03-21 MED ORDER — DICLOFENAC SODIUM 75 MG PO TBEC: 75.0000 mg | DELAYED_RELEASE_TABLET | Freq: Two times a day (BID) | ORAL | 2 refills | Status: DC | PRN

## 2022-03-21 MED ORDER — DULOXETINE HCL 30 MG PO CPEP: ORAL_CAPSULE | ORAL | 0 refills | Status: DC

## 2022-03-21 NOTE — Assessment & Plan Note (Signed)
Acute on chronic symptomatology with worsening over the past several weeks, did note improvement from prior corticosteroid injection.  Examination with tenderness at the right [facility] joint, positive cross body, negative impingement, counts are benign.  Given recurrence of symptomatology we discussed various treatment strategies, has elected to dose diclofenac scheduled, take relative rest, close follow-up with low threshold to advance to corticosteroid injection if symptoms persist without adequate improvement.

## 2022-03-21 NOTE — Patient Instructions (Signed)
-   Dose diclofenac twice daily x 2 weeks then twice daily on as-needed basis (take with food, no other NSAIDs while this medication) - Start duloxetine/Cymbalta, 1 capsule scheduled and increase per Rx instructions - Obtain x-rays of left ankle - Return for follow-up as scheduled

## 2022-03-21 NOTE — Assessment & Plan Note (Signed)
Has noted recurrence of symptoms in the setting of life stressors (new child, reduced sleep, etc.).  We discussed various treatment strategies and given his prior response to duloxetine, restart this course in titrating manner.

## 2022-03-21 NOTE — Progress Notes (Signed)
     Primary Care / Sports Medicine Office Visit  Patient Information:  Patient ID: Steve Simmons, male DOB: 1985/05/28 Age: 37 y.o. MRN: [id number]   Steve Simmons is a pleasant 37 y.o. male presenting with the following:  Chief Complaint  Patient presents with   Medication Refill   Arthralgia of left acromioclavicular joint    Feels like it collapse, feels catching.     Vitals:   03/21/22 1532  BP: 120/82  Pulse: 66  SpO2: 98%   Vitals:   03/21/22 1532  Weight: 228 lb (103.4 kg)  Height: 5' 9"$  (1.753 m)   Body mass index is 33.67 kg/m.  No results found.   Independent interpretation of notes and tests performed by another provider:   None  Procedures performed:   None  Pertinent History, Exam, Impression, and Recommendations:   Steve Simmons was seen today for medication refill and arthralgia of left acromioclavicular joint.  Arthralgia of left acromioclavicular joint Assessment & Plan: Acute on chronic symptomatology with worsening over the past several weeks, did note improvement from prior corticosteroid injection.  Examination with tenderness at the right [facility] joint, positive cross body, negative impingement, counts are benign.  Given recurrence of symptomatology we discussed various treatment strategies, has elected to dose diclofenac scheduled, take relative rest, close follow-up with low threshold to advance to corticosteroid injection if symptoms persist without adequate improvement.  Orders: -     Diclofenac Sodium; Take 1 tablet (75 mg total) by mouth 2 (two) times daily as needed.  Dispense: 60 tablet; Refill: 2  Chronic pain of left ankle Assessment & Plan: Chronic condition with intermittent catching, instability, localized the, aggravated with prolonged weightbearing given symptomatology, chronicity, concern for OCD lesion, dedicated x-rays ordered, will follow-up on this issue at return but will be placed on medication regimen.  Orders: -     DG Ankle  Complete Left; Future -     Diclofenac Sodium; Take 1 tablet (75 mg total) by mouth 2 (two) times daily as needed.  Dispense: 60 tablet; Refill: 2  Anxiety and depression Assessment & Plan: Has noted recurrence of symptoms in the setting of life stressors (new child, reduced sleep, etc.).  We discussed various treatment strategies and given his prior response to duloxetine, restart this course in titrating manner.  Orders: -     DULoxetine HCl; Take 1 capsule (30 mg total) by mouth every evening for 7 days, THEN 2 capsules (60 mg total) every evening for 23 days.  Dispense: 53 capsule; Refill: 0     Orders & Medications Meds ordered this encounter  Medications   diclofenac (VOLTAREN) 75 MG EC tablet    Sig: Take 1 tablet (75 mg total) by mouth 2 (two) times daily as needed.    Dispense:  60 tablet    Refill:  2   DULoxetine (CYMBALTA) 30 MG capsule    Sig: Take 1 capsule (30 mg total) by mouth every evening for 7 days, THEN 2 capsules (60 mg total) every evening for 23 days.    Dispense:  53 capsule    Refill:  0   Orders Placed This Encounter  Procedures   DG Ankle Complete Left     No follow-ups on file.     Montel Culver, MD, [facility]   Primary Care Sports Medicine [facility] and [facility] Medicine at [facility]

## 2022-03-21 NOTE — Assessment & Plan Note (Signed)
Chronic condition with intermittent catching, instability, localized the, aggravated with prolonged weightbearing given symptomatology, chronicity, concern for OCD lesion, dedicated x-rays ordered, will follow-up on this issue at return but will be placed on medication regimen.

## 2022-04-05 ENCOUNTER — Other Ambulatory Visit: Payer: Self-pay | Admitting: Family Medicine

## 2022-04-05 DIAGNOSIS — B002 Herpesviral gingivostomatitis and pharyngotonsillitis: Secondary | ICD-10-CM

## 2022-04-05 NOTE — Telephone Encounter (Signed)
Requested Prescriptions  Pending Prescriptions Disp Refills   valACYclovir (VALTREX) 500 MG tablet [Pharmacy Med Name: VALACYCLOVIR HCL 500 MG TABLET] 90 tablet 3    Sig: TAKE 1 TABLET (500 MG TOTAL) BY MOUTH DAILY.     Antimicrobials:  Antiviral Agents - Anti-Herpetic Passed - 04/05/2022 12:34 PM      Passed - Valid encounter within last 12 months    Recent Outpatient Visits           2 weeks ago Arthralgia of left acromioclavicular joint   [place] [facility] at [place], Earley Abide, MD   1 year ago Arthralgia of left acromioclavicular joint   [place] [facility] at [place], Earley Abide, MD   1 year ago Anxiety and depression   [place] at [place], Earley Abide, MD   1 year ago Arthralgia of left acromioclavicular joint    Primary Care & Sports Medicine at [place], Earley Abide, MD   1 year ago Recurrent oral herpes simplex   [facility] [place] at [place], MD       Future Appointments             In 3 weeks Zigmund Daniel, Earley Abide, MD [place] at [facility], [facility]

## 2022-04-16 ENCOUNTER — Other Ambulatory Visit: Payer: Self-pay | Admitting: Family Medicine

## 2022-04-18 NOTE — Telephone Encounter (Signed)
D/C 03/21/22.

## 2022-04-22 IMAGING — CR DG SHOULDER 2+V*L*
3 series · 3 of 3 positions shown · non-contrast
Comparison: None.

CLINICAL DATA: Chronic AC joint pain, biceps tendinitis

EXAM:
LEFT SHOULDER - 2+ VIEW

[shoulder grashey]
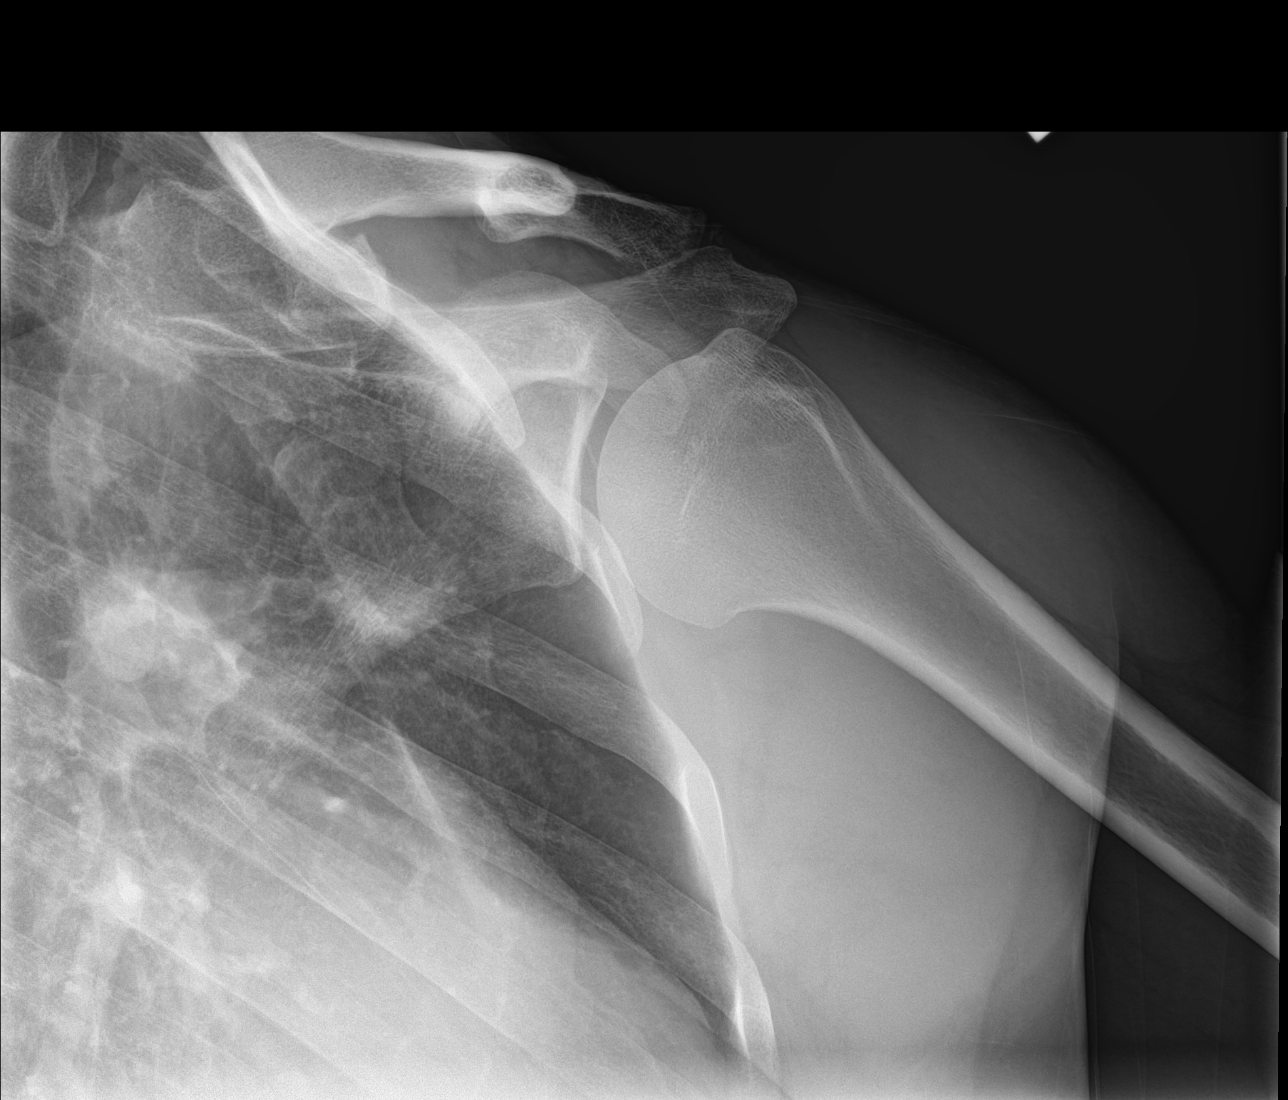

[shoulder axial]
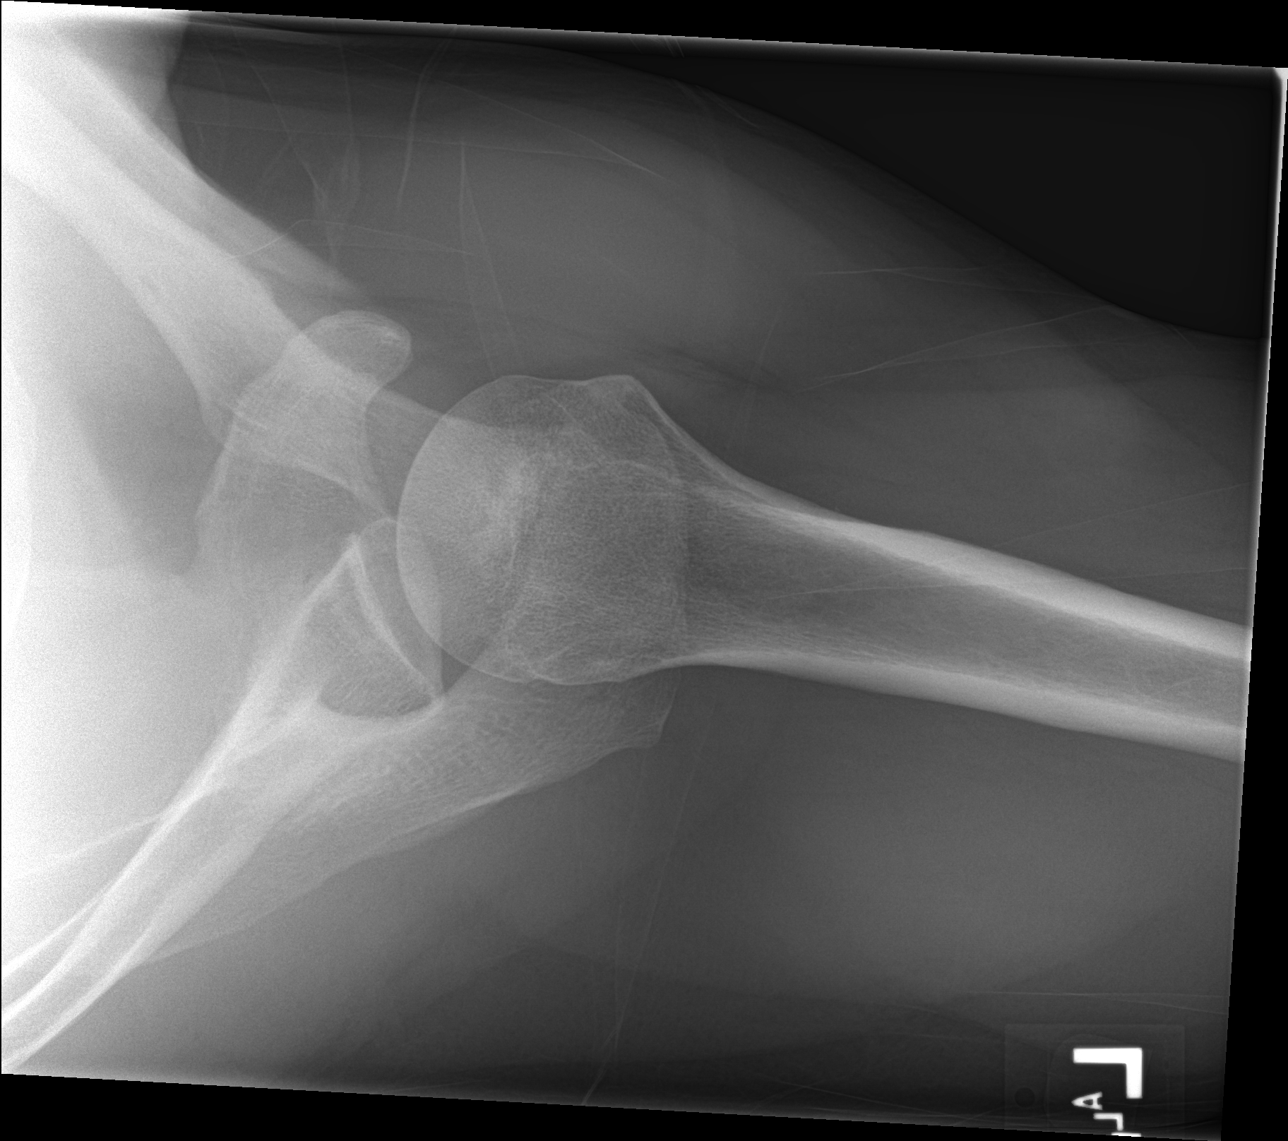

[shoulder y view]
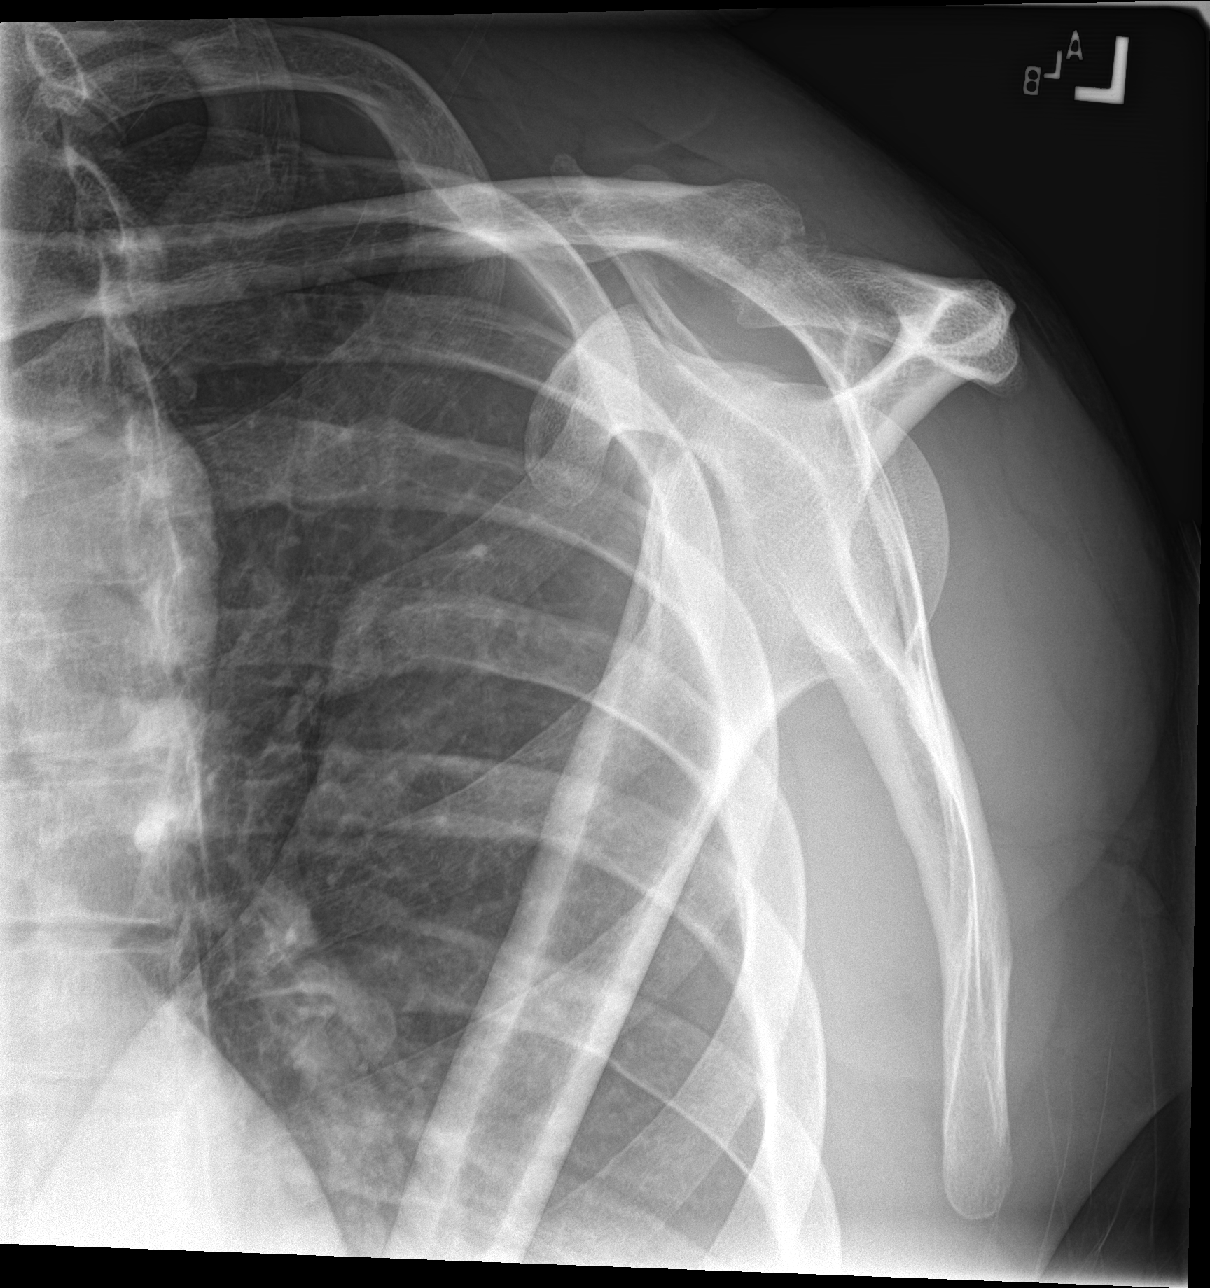

[3 of 3 positions shown; findings below may reference images not displayed]

FINDINGS: There is no evidence of fracture or dislocation. There is no
evidence of arthropathy or other focal bone abnormality. Soft
tissues are unremarkable. Minor AC joint irregularity without
separation or malalignment.
IMPRESSION: No acute finding by plain radiography.

## 2022-04-28 ENCOUNTER — Encounter: Payer: Self-pay | Admitting: Family Medicine

## 2022-04-28 ENCOUNTER — Ambulatory Visit (INDEPENDENT_AMBULATORY_CARE_PROVIDER_SITE_OTHER): Payer: BC Managed Care – PPO | Admitting: Family Medicine

## 2022-04-28 VITALS — BP 118/78 | HR 60 | Ht 69.0 in | Wt 228.0 lb

## 2022-04-28 DIAGNOSIS — F419 Anxiety disorder, unspecified: Secondary | ICD-10-CM | POA: Diagnosis not present

## 2022-04-28 DIAGNOSIS — Z13220 Encounter for screening for lipoid disorders: Secondary | ICD-10-CM | POA: Diagnosis not present

## 2022-04-28 DIAGNOSIS — B351 Tinea unguium: Secondary | ICD-10-CM

## 2022-04-28 DIAGNOSIS — Z0000 Encounter for general adult medical examination without abnormal findings: Secondary | ICD-10-CM

## 2022-04-28 DIAGNOSIS — F32A Depression, unspecified: Secondary | ICD-10-CM

## 2022-04-28 DIAGNOSIS — E559 Vitamin D deficiency, unspecified: Secondary | ICD-10-CM

## 2022-04-28 DIAGNOSIS — M19072 Primary osteoarthritis, left ankle and foot: Secondary | ICD-10-CM

## 2022-04-28 MED ORDER — CELECOXIB 100 MG PO CAPS: 100.0000 mg | ORAL_CAPSULE | Freq: Two times a day (BID) | ORAL | 2 refills | Status: DC | PRN

## 2022-04-28 MED ORDER — DULOXETINE HCL 60 MG PO CPEP: 60.0000 mg | ORAL_CAPSULE | Freq: Every day | ORAL | 1 refills | Status: DC

## 2022-04-28 NOTE — Assessment & Plan Note (Signed)
Tolerating medication well, symptoms controlled. No desire to titrate, no adverse effects.  - Continue current dose, refills x 6 months - Advised to contact us over interim for any issues, need to modify dose

## 2022-04-28 NOTE — Assessment & Plan Note (Signed)
Annual examination completed, risk stratification labs ordered, anticipatory guidance provided.  We will follow labs once resulted. 

## 2022-04-28 NOTE — Patient Instructions (Signed)
-   Obtain fasting labs with orders provided (can have water or black coffee but otherwise no food or drink x 8 hours before labs) - Review information provided - Attend eye doctor annually, dentist every 6 months, work towards or maintain 30 minutes of moderate intensity physical activity at least 5 days per week, and consume a balanced diet - Return in 1 year for physical - Contact us for any questions between now and then 

## 2022-04-28 NOTE — Assessment & Plan Note (Signed)
Chronic condition, obtained interim x-rays revealing degenerative changes about the ankle.  Pain is controlled with current diclofenac though having adverse effects from diclofenac.  Discussed various treatment options and plan as follows:  - Patient will consider custom orthotics, intra-articular corticosteroid injection, formal physical therapy - Transition from diclofenac to Celebrex as needed in effort to maintain symptom control without adverse effects - Follow-up as needed for this issue

## 2022-04-28 NOTE — Progress Notes (Signed)
Annual Physical Exam Visit  Patient Information:  Patient ID: Steve Simmons, male DOB: August 20, 1985 Age: 37 y.o. MRN: [id number]   Subjective:   CC: Annual Physical Exam  HPI:  Steve Simmons is here for their annual physical.  I reviewed the past medical history, family history, social history, surgical history, and allergies today and changes were made as necessary.  Please see the problem list section below for additional details.  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   Past Surgical History: Past Surgical History:  Procedure Laterality Date   PSEUDOANEURYSM REPAIR Right    temporal artery   WISDOM TOOTH EXTRACTION Bilateral    Family History: Family History  Problem Relation Age of Onset   Hypertension Mother    Diabetes Father    Prostate cancer Father    Obesity Brother    Diabetes Maternal Grandmother    Arthritis Maternal Grandmother    Heart attack Maternal Grandfather    Early death Maternal Grandfather    Arthritis Paternal Grandfather    Diabetes Paternal Grandfather    Allergies: No Known Allergies Health Maintenance: Health Maintenance  Topic Date Due   INFLUENZA VACCINE  08/31/2021   COVID-19 Vaccine (2 - 2023-24 season) 10/01/2021   DTaP/Tdap/Td (4 - Td or Tdap) 04/25/2029   Hepatitis C Screening  Completed   HIV Screening  Completed   HPV VACCINES  Aged Out    HM Colonoscopy     This patient has no relevant Health Maintenance data.      Medications: Current Outpatient Medications on File Prior to Visit  Medication Sig Dispense Refill   Multiple Vitamin (MULTIVITAMIN) tablet Take 1 tablet by mouth daily.     valACYclovir (VALTREX) 500 MG tablet TAKE 1 TABLET (500 MG TOTAL) BY MOUTH DAILY. 90 tablet 3   No current facility-administered medications on file prior to visit.    Review of Systems: No headache, visual changes, nausea, vomiting,  diarrhea, constipation, dizziness, abdominal pain, skin rash, fevers, chills, night sweats, swollen lymph nodes, weight loss, chest pain, body aches, joint swelling, muscle aches, shortness of breath, mood changes, visual or auditory hallucinations reported.  Objective:   Vitals:   04/28/22 1551  BP: 118/78  Pulse: 60  SpO2: 98%   Vitals:   04/28/22 1551  Weight: 228 lb (103.4 kg)  Height: 5\' 9"  (1.753 m)   Body mass index is 33.67 kg/m.  General: Well Developed, well nourished, and in no acute distress.  Neuro: Alert and oriented x3, extra-ocular muscles intact, sensation grossly intact. Cranial nerves II through XII are grossly intact, motor, sensory, and coordinative functions are intact. HEENT: Normocephalic, atraumatic, pupils equal round reactive to light, neck supple, no masses, no lymphadenopathy, thyroid nonpalpable. Oropharynx, nasopharynx, external ear canals are unremarkable. Skin: Warm and dry, no rashes noted.  Plantar warts and onychomycosis noted bilaterally. Cardiac: Regular rate and rhythm, no murmurs rubs or gallops. No peripheral edema. Pulses symmetric. Respiratory: Clear to auscultation bilaterally. Not using accessory muscles, speaking in full sentences.  Abdominal: Soft, nontender, nondistended, positive bowel sounds, no masses, no organomegaly. Musculoskeletal: Shoulder, elbow, wrist, hip, knee, ankle stable, and with full range of motion.   Impression and Recommendations:   The patient was counselled, risk factors were discussed, and anticipatory guidance given.  Problem List Items Addressed This Visit       Musculoskeletal and Integument   Localized  osteoarthritis of ankle, left    Chronic condition, obtained interim x-rays revealing degenerative changes about the ankle.  Pain is controlled with current diclofenac though having adverse effects from diclofenac.  Discussed various treatment options and plan as follows:  - Patient will consider custom  orthotics, intra-articular corticosteroid injection, formal physical therapy - Transition from diclofenac to Celebrex as needed in effort to maintain symptom control without adverse effects - Follow-up as needed for this issue      Relevant Medications   celecoxib (CELEBREX) 100 MG capsule   Onychomycosis    Incidentally noted on today's examination, patient reports chronicity.  Treatment strategy relayed.  Plan as follows:  - Patient will contact us if interested in pursuing treatment which we will coordinate through podiatry referral        Other   Annual physical exam - Primary    Annual examination completed, risk stratification labs ordered, anticipatory guidance provided.  We will follow labs once resulted.      Relevant Orders   CBC   Comprehensive metabolic panel   VITAMIN D 25 Hydroxy (Vit-D Deficiency, Fractures)   TSH   Lipid panel   Anxiety and depression    Tolerating medication well, symptoms controlled. No desire to titrate, no adverse effects.  - Continue current dose, refills x 6 months - Advised to contact us over interim for any issues, need to modify dose      Relevant Medications   DULoxetine (CYMBALTA) 60 MG capsule   Other Visit Diagnoses     Healthcare maintenance       Relevant Orders   CBC   Comprehensive metabolic panel   VITAMIN D 25 Hydroxy (Vit-D Deficiency, Fractures)   TSH   Lipid panel   Vitamin D deficiency       Relevant Orders   VITAMIN D 25 Hydroxy (Vit-D Deficiency, Fractures)   Screening for lipoid disorders       Relevant Orders   Comprehensive metabolic panel   Lipid panel        Orders & Medications Medications:  Meds ordered this encounter  Medications   DULoxetine (CYMBALTA) 60 MG capsule    Sig: Take 1 capsule (60 mg total) by mouth daily.    Dispense:  90 capsule    Refill:  1   celecoxib (CELEBREX) 100 MG capsule    Sig: Take 1 capsule (100 mg total) by mouth 2 (two) times daily as needed.    Dispense:  60  capsule    Refill:  2   Orders Placed This Encounter  Procedures   CBC   Comprehensive metabolic panel   VITAMIN D 25 Hydroxy (Vit-D Deficiency, Fractures)   TSH   Lipid panel     No follow-ups on file.    Montel Culver, MD, [facility]   Primary Care Sports Medicine Primary Care and Sports Medicine at [facility]

## 2022-04-28 NOTE — Assessment & Plan Note (Signed)
Incidentally noted on today's examination, patient reports chronicity.  Treatment strategy relayed.  Plan as follows:  - Patient will contact us if interested in pursuing treatment which we will coordinate through podiatry referral

## 2022-05-04 DIAGNOSIS — Z0000 Encounter for general adult medical examination without abnormal findings: Secondary | ICD-10-CM | POA: Diagnosis not present

## 2022-05-04 DIAGNOSIS — Z13220 Encounter for screening for lipoid disorders: Secondary | ICD-10-CM | POA: Diagnosis not present

## 2022-05-04 DIAGNOSIS — E559 Vitamin D deficiency, unspecified: Secondary | ICD-10-CM | POA: Diagnosis not present

## 2022-05-05 LAB — COMPREHENSIVE METABOLIC PANEL
ALT: 36 IU/L (ref 0–44)
AST: 38 IU/L (ref 0–40)
Albumin/Globulin Ratio: 1.8 (ref 1.2–2.2)
Albumin: 4.6 g/dL (ref 4.1–5.1)
Alkaline Phosphatase: 70 IU/L (ref 44–121)
BUN/Creatinine Ratio: 12 (ref 9–20)
BUN: 15 mg/dL (ref 6–20)
Bilirubin Total: 0.4 mg/dL (ref 0.0–1.2)
CO2: 24 mmol/L (ref 20–29)
Calcium: 9.8 mg/dL (ref 8.7–10.2)
Chloride: 101 mmol/L (ref 96–106)
Creatinine, Ser: 1.21 mg/dL (ref 0.76–1.27)
Globulin, Total: 2.5 g/dL (ref 1.5–4.5)
Glucose: 87 mg/dL (ref 70–99)
Potassium: 4.5 mmol/L (ref 3.5–5.2)
Sodium: 139 mmol/L (ref 134–144)
Total Protein: 7.1 g/dL (ref 6.0–8.5)
eGFR: 79 mL/min/{1.73_m2} (ref >59)

## 2022-05-05 LAB — LIPID PANEL
Chol/HDL Ratio: 3.1 ratio (ref 0.0–5.0)
Cholesterol, Total: 160 mg/dL (ref 100–199)
HDL: 51 mg/dL (ref >39)
LDL Chol Calc (NIH): 99 mg/dL (ref 0–99)
Triglycerides: 46 mg/dL (ref 0–149)
VLDL Cholesterol Cal: 10 mg/dL (ref 5–40)

## 2022-05-05 LAB — VITAMIN D 25 HYDROXY (VIT D DEFICIENCY, FRACTURES): Vit D, 25-Hydroxy: 48.8 ng/mL (ref 30.0–100.0)

## 2022-05-05 LAB — CBC
Hematocrit: 43.3 % (ref 37.5–51.0)
Hemoglobin: 15 g/dL (ref 13.0–17.7)
MCH: 30.7 pg (ref 26.6–33.0)
MCHC: 34.6 g/dL (ref 31.5–35.7)
MCV: 89 fL (ref 79–97)
Platelets: 230 10*3/uL (ref 150–450)
RBC: 4.88 x10E6/uL (ref 4.14–5.80)
RDW: 12.4 % (ref 11.6–15.4)
WBC: 3.4 10*3/uL (ref 3.4–10.8)

## 2022-05-05 LAB — TSH: TSH: 2.4 u[IU]/mL (ref 0.450–4.500)

## 2022-06-02 ENCOUNTER — Encounter (INDEPENDENT_AMBULATORY_CARE_PROVIDER_SITE_OTHER): Payer: BC Managed Care – PPO | Admitting: Family Medicine

## 2022-06-02 ENCOUNTER — Other Ambulatory Visit: Payer: Self-pay | Admitting: Family Medicine

## 2022-06-02 DIAGNOSIS — L309 Dermatitis, unspecified: Secondary | ICD-10-CM | POA: Diagnosis not present

## 2022-06-02 MED ORDER — HYDROCORTISONE 2.5 % EX CREA: TOPICAL_CREAM | CUTANEOUS | 1 refills | Status: AC

## 2022-06-02 NOTE — Telephone Encounter (Signed)
Please advsie

## 2022-06-02 NOTE — Telephone Encounter (Signed)
Please see the MyChart message reply(ies) for my assessment and plan.    This patient gave consent for this Medical Advice Message and is aware that it may result in a bill to [employer], as well as the possibility of receiving a bill for a co-payment or deductible. They are an established patient, but are not seeking medical advice exclusively about a problem treated during an in person or video visit in the last seven days. I did not recommend an in person or video visit within seven days of my reply.    I spent a total of 23 minutes cumulative time within 7 days through [employer].  Jerrol Banana, MD

## 2022-06-22 ENCOUNTER — Other Ambulatory Visit: Payer: Self-pay | Admitting: Family Medicine

## 2022-06-22 DIAGNOSIS — G8929 Other chronic pain: Secondary | ICD-10-CM

## 2022-06-22 DIAGNOSIS — M25512 Pain in left shoulder: Secondary | ICD-10-CM

## 2022-06-22 NOTE — Telephone Encounter (Signed)
Requested medication (s) are due for refill today: -  Requested medication (s) are on the active medication list: no  Last refill:  03/21/22 with end date 04/28/22  Future visit scheduled: yes  Notes to clinic:  can this be refilled - looks like it had ended but dont see it was dc'd    Requested Prescriptions  Pending Prescriptions Disp Refills   diclofenac (VOLTAREN) 75 MG EC tablet [Pharmacy Med Name: DICLOFENAC SOD EC 75 MG TAB] 60 tablet 2    Sig: TAKE 1 TABLET BY MOUTH 2 TIMES DAILY AS NEEDED.     Analgesics:  NSAIDS Failed - 06/22/2022  2:16 AM      Failed - Manual Review: Labs are only required if the patient has taken medication for more than 8 weeks.      Passed - Cr in normal range and within 360 days    Creatinine, Ser  Date Value Ref Range Status  05/04/2022 1.21 0.76 - 1.27 mg/dL Final         Passed - HGB in normal range and within 360 days    Hemoglobin  Date Value Ref Range Status  05/04/2022 15.0 13.0 - 17.7 g/dL Final         Passed - PLT in normal range and within 360 days    Platelets  Date Value Ref Range Status  05/04/2022 230 150 - 450 x10E3/uL Final         Passed - HCT in normal range and within 360 days    Hematocrit  Date Value Ref Range Status  05/04/2022 43.3 37.5 - 51.0 % Final         Passed - eGFR is 30 or above and within 360 days    eGFR  Date Value Ref Range Status  05/04/2022 79 >59 mL/min/1.73 Final         Passed - Patient is not pregnant      Passed - Valid encounter within last 12 months    Recent Outpatient Visits           1 month ago Annual physical exam   [place] [facility] at [facility], Ocie Bob, MD   3 months ago Arthralgia of left acromioclavicular joint   [place] [facility] at [facility], Ocie Bob, MD   1 year ago Arthralgia of left acromioclavicular joint   [place] [facility] at [facility], Ocie Bob, MD   1 year ago Anxiety and depression   [facility] at [facility], Ocie Bob, MD   1 year ago Arthralgia of left acromioclavicular joint   [place] [facility] Care & Sports Medicine at [facility], Ocie Bob, MD       Future Appointments             In 10 months Ashley Royalty, Ocie Bob, MD [facility] Primary Care & Sports Medicine at [facility], [facility]

## 2022-06-22 NOTE — Telephone Encounter (Signed)
LEFT VOICE MAIL TO SET UP MEDICATION REFILL APPOINTMENT.

## 2022-09-27 ENCOUNTER — Encounter: Payer: Self-pay | Admitting: Family Medicine

## 2022-09-27 ENCOUNTER — Ambulatory Visit (INDEPENDENT_AMBULATORY_CARE_PROVIDER_SITE_OTHER): Payer: BC Managed Care – PPO | Admitting: Family Medicine

## 2022-09-27 ENCOUNTER — Other Ambulatory Visit (INDEPENDENT_AMBULATORY_CARE_PROVIDER_SITE_OTHER): Payer: BC Managed Care – PPO | Admitting: Radiology

## 2022-09-27 VITALS — BP 128/78 | HR 62 | Ht 69.0 in | Wt 229.0 lb

## 2022-09-27 DIAGNOSIS — M19072 Primary osteoarthritis, left ankle and foot: Secondary | ICD-10-CM

## 2022-09-27 MED ORDER — DICLOFENAC SODIUM 75 MG PO TBEC: 75.0000 mg | DELAYED_RELEASE_TABLET | Freq: Two times a day (BID) | ORAL | 0 refills | Status: DC | PRN

## 2022-09-27 MED ORDER — TRIAMCINOLONE ACETONIDE 40 MG/ML IJ SUSP
40.0000 mg | Freq: Once | INTRAMUSCULAR | Status: AC
  Administered 2022-09-27: 40 mg via INTRAMUSCULAR

## 2022-09-30 ENCOUNTER — Other Ambulatory Visit: Payer: Self-pay | Admitting: Family Medicine

## 2022-09-30 DIAGNOSIS — F419 Anxiety disorder, unspecified: Secondary | ICD-10-CM

## 2022-09-30 NOTE — Patient Instructions (Signed)
You have just been given a cortisone injection to reduce pain and inflammation. After the injection you may notice immediate relief of pain as a result of the Lidocaine. It is important to rest the area of the injection for 24 to 48 hours after the injection. There is a possibility of some temporary increased discomfort and swelling for up to 72 hours until the cortisone begins to work. If you do have pain, simply rest the joint and use ice. If you can tolerate over the counter medications, you can try Tylenol, Aleve, or Advil for added relief per package instructions. - Switch back to diclofenac twice daily (as-needed) dosing for ankle pain until cortisone takes effect - Relative rest x 2 days - Follow-up with orthopedics - Contact us for questions / concerns

## 2022-09-30 NOTE — Telephone Encounter (Signed)
Requested Prescriptions  Pending Prescriptions Disp Refills   DULoxetine (CYMBALTA) 60 MG capsule [Pharmacy Med Name: DULOXETINE HCL DR 60 MG CAP] 90 capsule 1    Sig: TAKE 1 CAPSULE BY MOUTH EVERY DAY     Psychiatry: Antidepressants - SNRI - duloxetine Passed - 09/30/2022  2:34 AM      Passed - Cr in normal range and within 360 days    Creatinine, Ser  Date Value Ref Range Status  05/04/2022 1.21 0.76 - 1.27 mg/dL Final         Passed - eGFR is 30 or above and within 360 days    eGFR  Date Value Ref Range Status  05/04/2022 79 >59 mL/min/1.73 Final         Passed - Completed PHQ-2 or PHQ-9 in the last 360 days      Passed - Last BP in normal range    BP Readings from Last 1 Encounters:  09/27/22 128/78         Passed - Valid encounter within last 6 months    Recent Outpatient Visits           3 days ago Localized osteoarthritis of ankle, left   [place] [facility] at [facility], Ocie Bob, MD   5 months ago Annual physical exam   [facility] at [facility], Ocie Bob, MD   6 months ago Arthralgia of left acromioclavicular joint   [place] [facility] at [facility], Ocie Bob, MD   1 year ago Arthralgia of left acromioclavicular joint   [place] [facility] at [facility], Ocie Bob, MD   1 year ago Anxiety and depression   [facility] Primary Care & Sports Medicine at [facility], Ocie Bob, MD       Future Appointments             In 7 months Ashley Royalty, Ocie Bob, MD [facility] Primary Care & Sports Medicine at [facility], [facility]

## 2022-09-30 NOTE — Assessment & Plan Note (Signed)
Chronic with ongoing symptomatology despite recent trial of Celebrex. 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   Examination correlates pain the the region of osteophytosis noted laterally, just inferior to his lateral malleolus on x-rays. Given limited response to NSAIDs, plan as follows:  - Patient did elect to proceed with ultrasound-guided corticosteroid injection to the lateral subtalar joint - Transition back to diclofenac as patient cites better response for interim symptom control until steroid takes effect - Referral to orthopedic foot & ankle for definitive management option consultation, patient understands that following corticosteroid injection most surgeries are withheld for 3 months for safety - Return as-needed

## 2022-09-30 NOTE — Progress Notes (Signed)
     Primary Care / Sports Medicine Office Visit  Patient Information:  Patient ID: Steve Simmons, male DOB: 1986-01-13 Age: 37 y.o. MRN: [id number]   Steve Simmons is a pleasant 37 y.o. male presenting with the following:  Chief Complaint  Patient presents with   Ankle Pain    Left ankle pain. Pain has been present for years. Was told in px its arthritis and bone spur. Patient interested in injection of the ankle. Pain causes patient to limp at times. Patient is running 2-3 times week.     Vitals:   09/27/22 1515  BP: 128/78  Pulse: 62  SpO2: 99%   Vitals:   09/27/22 1515  Weight: 229 lb (103.9 kg)  Height: 5\' 9"  (1.753 m)   Body mass index is 33.82 kg/m.  No results found.   Independent interpretation of notes and tests performed by another provider:   None  Procedures performed:   Procedure:  Injection of left lateral subtalar joint under ultrasound guidance. Ultrasound guidance utilized for out-of-plane approach to lateral subtalar joint, dynamic joint motion visualized, sonographic localization of osteophytosis targeted  Samsung HS60 device utilized with permanent recording / reporting. Verbal informed consent obtained and verified. Skin prepped in a sterile fashion. Ethyl chloride for topical local analgesia.  Completed without difficulty and tolerated well. Medication: triamcinolone acetonide 40 mg/mL suspension for injection 1 mL total and 2 mL lidocaine 1% without epinephrine utilized for needle placement anesthetic Advised to contact for fevers/chills, erythema, induration, drainage, or persistent bleeding.   Pertinent History, Exam, Impression, and Recommendations:   Steve Simmons was seen today for ankle pain.  Localized osteoarthritis of ankle, left Assessment & Plan: Chronic with ongoing symptomatology despite recent trial of Celebrex. 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   Examination correlates pain the the region of osteophytosis noted laterally, just inferior to his lateral malleolus on x-rays. Given limited response to NSAIDs, plan as follows:  - Patient did elect to proceed with ultrasound-guided corticosteroid injection to the lateral subtalar joint - Transition back to diclofenac as patient cites better response for interim symptom control until steroid takes effect - Referral to orthopedic foot & ankle for definitive management option consultation, patient understands that following corticosteroid injection most surgeries are withheld for 3 months for safety - Return as-needed  Orders: -     [place] LIMITED JOINT SPACE STRUCTURES LOW LEFT; Future -     Triamcinolone Acetonide -     Diclofenac Sodium; Take 1 tablet (75 mg total) by mouth 2 (two) times daily as needed.  Dispense: 60 tablet; Refill: 0 -     Ambulatory referral to Orthopedic Surgery     Orders & Medications Meds ordered this encounter  Medications   triamcinolone acetonide (KENALOG-40) injection 40 mg   diclofenac (VOLTAREN) 75 MG EC tablet    Sig: Take 1 tablet (75 mg total) by mouth 2 (two) times daily as needed.    Dispense:  60 tablet    Refill:  0   Orders Placed This Encounter  Procedures   [place] LIMITED JOINT SPACE STRUCTURES LOW LEFT   Ambulatory referral to Orthopedic Surgery     No follow-ups on file.     Jerrol Banana, MD, [facility]   Primary Care Sports Medicine Primary Care and Sports Medicine at [facility]

## 2022-10-26 ENCOUNTER — Other Ambulatory Visit: Payer: Self-pay | Admitting: Family Medicine

## 2022-10-26 DIAGNOSIS — M19072 Primary osteoarthritis, left ankle and foot: Secondary | ICD-10-CM

## 2022-10-26 NOTE — Telephone Encounter (Signed)
Requested Prescriptions  Pending Prescriptions Disp Refills   diclofenac (VOLTAREN) 75 MG EC tablet [Pharmacy Med Name: DICLOFENAC SOD EC 75 MG TAB] 60 tablet 0    Sig: TAKE 1 TABLET BY MOUTH 2 TIMES DAILY AS NEEDED.     Analgesics:  NSAIDS Failed - 10/26/2022  2:55 AM      Failed - Manual Review: Labs are only required if the patient has taken medication for more than 8 weeks.      Passed - Cr in normal range and within 360 days    Creatinine, Ser  Date Value Ref Range Status  05/04/2022 1.21 0.76 - 1.27 mg/dL Final         Passed - HGB in normal range and within 360 days    Hemoglobin  Date Value Ref Range Status  05/04/2022 15.0 13.0 - 17.7 g/dL Final         Passed - PLT in normal range and within 360 days    Platelets  Date Value Ref Range Status  05/04/2022 230 150 - 450 x10E3/uL Final         Passed - HCT in normal range and within 360 days    Hematocrit  Date Value Ref Range Status  05/04/2022 43.3 37.5 - 51.0 % Final         Passed - eGFR is 30 or above and within 360 days    eGFR  Date Value Ref Range Status  05/04/2022 79 >59 mL/min/1.73 Final         Passed - Patient is not pregnant      Passed - Valid encounter within last 12 months    Recent Outpatient Visits           4 weeks ago Localized osteoarthritis of ankle, left   [place] [facility] at [facility], Ocie Bob, MD   6 months ago Annual physical exam   [facility] at [facility], Ocie Bob, MD   7 months ago Arthralgia of left acromioclavicular joint   [place] [facility] Medicine at [facility], Ocie Bob, MD   1 year ago Arthralgia of left acromioclavicular joint   [place] [facility] & [facility] Medicine at [facility], Ocie Bob, MD   1 year ago Anxiety and depression   [facility] Primary Care & Sports Medicine at [facility], Ocie Bob, MD       Future  Appointments             In 6 months Ashley Royalty, Ocie Bob, MD [facility] Primary Care & Sports Medicine at [facility], [facility]

## 2022-11-25 ENCOUNTER — Other Ambulatory Visit: Payer: Self-pay | Admitting: Family Medicine

## 2022-11-25 DIAGNOSIS — M19072 Primary osteoarthritis, left ankle and foot: Secondary | ICD-10-CM

## 2022-11-25 NOTE — Telephone Encounter (Signed)
Requested Prescriptions  Pending Prescriptions Disp Refills   diclofenac (VOLTAREN) 75 MG EC tablet [Pharmacy Med Name: DICLOFENAC SOD EC 75 MG TAB] 180 tablet 0    Sig: TAKE 1 TABLET BY MOUTH 2 TIMES DAILY AS NEEDED.     Analgesics:  NSAIDS Failed - 11/25/2022  2:42 AM      Failed - Manual Review: Labs are only required if the patient has taken medication for more than 8 weeks.      Passed - Cr in normal range and within 360 days    Creatinine, Ser  Date Value Ref Range Status  05/04/2022 1.21 0.76 - 1.27 mg/dL Final         Passed - HGB in normal range and within 360 days    Hemoglobin  Date Value Ref Range Status  05/04/2022 15.0 13.0 - 17.7 g/dL Final         Passed - PLT in normal range and within 360 days    Platelets  Date Value Ref Range Status  05/04/2022 230 150 - 450 x10E3/uL Final         Passed - HCT in normal range and within 360 days    Hematocrit  Date Value Ref Range Status  05/04/2022 43.3 37.5 - 51.0 % Final         Passed - eGFR is 30 or above and within 360 days    eGFR  Date Value Ref Range Status  05/04/2022 79 >59 mL/min/1.73 Final         Passed - Patient is not pregnant      Passed - Valid encounter within last 12 months    Recent Outpatient Visits           1 month ago Localized osteoarthritis of ankle, left   [place] [facility] Medicine at [facility], Ocie Bob, MD   7 months ago Annual physical exam   [facility] Primary Care & Sports Medicine at [facility], Ocie Bob, MD   8 months ago Arthralgia of left acromioclavicular joint   [place] Primary [facility] & [facility] Medicine at [facility], Ocie Bob, MD   1 year ago Arthralgia of left acromioclavicular joint   [place] Primary Care & Sports Medicine at [facility], Ocie Bob, MD   1 year ago Anxiety and depression   [facility] Primary Care & Sports Medicine at [facility], Ocie Bob, MD       Future  Appointments             In 5 months Ashley Royalty, Ocie Bob, MD [facility] Primary Care & Sports Medicine at [facility], [facility]

## 2022-12-11 DIAGNOSIS — M19079 Primary osteoarthritis, unspecified ankle and foot: Secondary | ICD-10-CM | POA: Insufficient documentation

## 2022-12-11 DIAGNOSIS — M19072 Primary osteoarthritis, left ankle and foot: Secondary | ICD-10-CM | POA: Diagnosis not present

## 2023-01-12 DIAGNOSIS — M12572 Traumatic arthropathy, left ankle and foot: Secondary | ICD-10-CM | POA: Diagnosis not present

## 2023-01-12 DIAGNOSIS — S93419A Sprain of calcaneofibular ligament of unspecified ankle, initial encounter: Secondary | ICD-10-CM | POA: Insufficient documentation

## 2023-01-12 DIAGNOSIS — M25872 Other specified joint disorders, left ankle and foot: Secondary | ICD-10-CM | POA: Diagnosis not present

## 2023-01-12 DIAGNOSIS — S93402A Sprain of unspecified ligament of left ankle, initial encounter: Secondary | ICD-10-CM | POA: Insufficient documentation

## 2023-02-28 ENCOUNTER — Other Ambulatory Visit: Payer: Self-pay | Admitting: Family Medicine

## 2023-02-28 DIAGNOSIS — M19072 Primary osteoarthritis, left ankle and foot: Secondary | ICD-10-CM

## 2023-02-28 NOTE — Telephone Encounter (Signed)
Requested Prescriptions  Pending Prescriptions Disp Refills   diclofenac (VOLTAREN) 75 MG EC tablet [Pharmacy Med Name: DICLOFENAC SOD EC 75 MG TAB] 180 tablet 0    Sig: TAKE 1 TABLET BY MOUTH TWICE A DAY AS NEEDED     Analgesics:  NSAIDS Failed - 02/28/2023  8:44 AM      Failed - Manual Review: Labs are only required if the patient has taken medication for more than 8 weeks.      Passed - Cr in normal range and within 360 days    Creatinine, Ser  Date Value Ref Range Status  05/04/2022 1.21 0.76 - 1.27 mg/dL Final         Passed - HGB in normal range and within 360 days    Hemoglobin  Date Value Ref Range Status  05/04/2022 15.0 13.0 - 17.7 g/dL Final         Passed - PLT in normal range and within 360 days    Platelets  Date Value Ref Range Status  05/04/2022 230 150 - 450 x10E3/uL Final         Passed - HCT in normal range and within 360 days    Hematocrit  Date Value Ref Range Status  05/04/2022 43.3 37.5 - 51.0 % Final         Passed - eGFR is 30 or above and within 360 days    eGFR  Date Value Ref Range Status  05/04/2022 79 >59 mL/min/1.73 Final         Passed - Patient is not pregnant      Passed - Valid encounter within last 12 months    Recent Outpatient Visits           5 months ago Localized osteoarthritis of ankle, left   [place] [facility] at [facility], Ocie Bob, MD   10 months ago Annual physical exam   [facility] Primary Care & Sports Medicine at [facility], Ocie Bob, MD   11 months ago Arthralgia of left acromioclavicular joint   [place] Primary [facility] Medicine at [facility], Ocie Bob, MD   1 year ago Arthralgia of left acromioclavicular joint   [place] Primary Care & Sports Medicine at [facility], Ocie Bob, MD   2 years ago Anxiety and depression   [facility] Care [facility] Sports Medicine at [facility], Ocie Bob, MD       Future  Appointments             In 2 months Ashley Royalty, Ocie Bob, MD [facility] Primary Care & Sports Medicine at [facility], [facility]

## 2023-03-27 ENCOUNTER — Encounter: Payer: Self-pay | Admitting: Family Medicine

## 2023-03-27 ENCOUNTER — Ambulatory Visit: Payer: BC Managed Care – PPO | Admitting: Family Medicine

## 2023-03-27 ENCOUNTER — Other Ambulatory Visit (INDEPENDENT_AMBULATORY_CARE_PROVIDER_SITE_OTHER): Payer: Self-pay | Admitting: Radiology

## 2023-03-27 VITALS — BP 112/80 | HR 70 | Ht 69.0 in | Wt 233.0 lb

## 2023-03-27 DIAGNOSIS — R4586 Emotional lability: Secondary | ICD-10-CM

## 2023-03-27 DIAGNOSIS — F419 Anxiety disorder, unspecified: Secondary | ICD-10-CM

## 2023-03-27 DIAGNOSIS — M19072 Primary osteoarthritis, left ankle and foot: Secondary | ICD-10-CM

## 2023-03-27 DIAGNOSIS — G478 Other sleep disorders: Secondary | ICD-10-CM | POA: Insufficient documentation

## 2023-03-27 DIAGNOSIS — R6882 Decreased libido: Secondary | ICD-10-CM

## 2023-03-27 DIAGNOSIS — F32A Depression, unspecified: Secondary | ICD-10-CM

## 2023-03-27 MED ORDER — DULOXETINE HCL 30 MG PO CPEP: 30.0000 mg | ORAL_CAPSULE | Freq: Every day | ORAL | 0 refills | Status: DC

## 2023-03-27 NOTE — Progress Notes (Signed)
 Primary Care / Sports Medicine Office Visit  Patient Information:  Patient ID: Steve Simmons, male DOB: 02-22-1985 Age: 38 y.o. MRN: [id number]   Steve Simmons is a pleasant 38 y.o. male presenting with the following:  Chief Complaint  Patient presents with   Ankle Pain    Patient requesting cortisone injection.     Vitals:   03/27/23 1413  BP: 112/80  Pulse: 70  SpO2: 99%   Vitals:   03/27/23 1413  Weight: 233 lb (105.7 kg)  Height: 5\' 9"  (1.753 m)   Body mass index is 34.41 kg/m.  No results found.   Independent interpretation of notes and tests performed by another provider:   None  Procedures performed:   Procedure:  Injection of left lateral subtalar joint under ultrasound guidance. Ultrasound guidance utilized for out-of-plane approach to lateral subtalar joint, dynamic joint motion visualized, sonographic localization of osteophytosis targeted  Samsung HS60 device utilized with permanent recording / reporting. Verbal informed consent obtained and verified. Skin prepped in a sterile fashion. Ethyl chloride for topical local analgesia.  Completed without difficulty and tolerated well. Medication: triamcinolone acetonide 40 mg/mL suspension for injection 1 mL total and 2 mL lidocaine 1% without epinephrine utilized for needle placement anesthetic Advised to contact for fevers/chills, erythema, induration, drainage, or persistent bleeding.  Pertinent History, Exam, Impression, and Recommendations:   Problem List Items Addressed This Visit     Anxiety and depression   He reports a decrease in libido and a change in mood, which he attributes to his current medication, duloxetine. He feels it affects his sense of self. He has been on a consistent workout routine, which has improved his mood.  Mood Changes Reports feeling less like himself on duloxetine, with decreased libido and mood changes. Discussed potential impact of sleep apnea and low testosterone on  mood. -Reduce duloxetine to 30mg  for one-two weeks, then discontinue. -Order testosterone level. -Refer to sleep medicine for evaluation of potential sleep apnea.      Relevant Medications   DULoxetine (CYMBALTA) 30 MG capsule   Other Relevant Orders   Testosterone   Localized osteoarthritis of ankle, left - Primary   He has chronic ankle pain in the setting of degenerative changes following multiple inversion injuries.  He has seen an orthopedic foot and ankle specialist over the interim who did offer surgery however stated that it "would not fix the damage". The specialist suggested managing the pain with injections as needed. He received a cortisone injection on September 27, 2022, which provided significant relief until October 2024 when strenuous activities, including climbing and running, exacerbated the pain. The pain is severe during physical activities such as running, particularly noting difficulty during a 5K run. He experiences a 'hitch' in his ankle during the first quarter mile, leading to compensatory pressure on his right leg, causing numbness. He uses diclofenac for pain management and has tried a compression sleeve for support.  Left ankle osteoarthritis Chronic pain despite use of diclofenac as needed. Cortisone injection in August provided relief. Discussed potential benefits of PRP and stem cell therapy. Noted compensatory pain in right leg and Achilles tendon due to altered gait. -Administer cortisone injection today under ultrasound guidance. -Continue diclofenac as needed. -Consider PRP or stem cell therapy for long-term management. -Recommend use of lace-up ankle stabilizer for additional support as needed in addition to routine ankle conditioning.      Relevant Orders   [place] LIMITED JOINT SPACE STRUCTURES LOW LEFT   Non-restorative sleep  He notes potential sleep issues, as indicated by non-restorative sleep patterns recorded by his watch, and a history of snoring. His  father uses a CPAP machine for sleep apnea.  OSA concern -Referral placed to sleep medicine for further evaluation and management      Relevant Orders   Ambulatory referral to Pulmonology   Testosterone   Other Visit Diagnoses       Mood change       Relevant Orders   Testosterone     Low libido       Relevant Orders   Testosterone        Orders & Medications Medications:  Meds ordered this encounter  Medications   DULoxetine (CYMBALTA) 30 MG capsule    Sig: Take 1 capsule (30 mg total) by mouth daily. X 1-2 weeks then discontinue.    Dispense:  14 capsule    Refill:  0   Orders Placed This Encounter  Procedures   [place] LIMITED JOINT SPACE STRUCTURES LOW LEFT   Testosterone   Ambulatory referral to Pulmonology     No follow-ups on file.     Jerrol Banana, MD, [facility]   Primary Care Sports Medicine Primary Care and Sports Medicine at [facility]

## 2023-03-27 NOTE — Assessment & Plan Note (Signed)
 He has chronic ankle pain in the setting of degenerative changes following multiple inversion injuries.  He has seen an orthopedic foot and ankle specialist over the interim who did offer surgery however stated that it "would not fix the damage". The specialist suggested managing the pain with injections as needed. He received a cortisone injection on September 27, 2022, which provided significant relief until October 2024 when strenuous activities, including climbing and running, exacerbated the pain. The pain is severe during physical activities such as running, particularly noting difficulty during a 5K run. He experiences a 'hitch' in his ankle during the first quarter mile, leading to compensatory pressure on his right leg, causing numbness. He uses diclofenac for pain management and has tried a compression sleeve for support.  Left ankle osteoarthritis Chronic pain despite use of diclofenac as needed. Cortisone injection in August provided relief. Discussed potential benefits of PRP and stem cell therapy. Noted compensatory pain in right leg and Achilles tendon due to altered gait. -Administer cortisone injection today under ultrasound guidance. -Continue diclofenac as needed. -Consider PRP or stem cell therapy for long-term management. -Recommend use of lace-up ankle stabilizer for additional support as needed in addition to routine ankle conditioning.

## 2023-03-27 NOTE — Patient Instructions (Signed)
 You have just been given a cortisone injection to reduce pain and inflammation. After the injection you may notice immediate relief of pain as a result of the Lidocaine. It is important to rest the area of the injection for 24 to 48 hours after the injection. There is a possibility of some temporary increased discomfort and swelling for up to 72 hours until the cortisone begins to work. If you do have pain, simply rest the joint and use ice. If you can tolerate over the counter medications, you can try Tylenol, Aleve, or Advil for added relief per package instructions.   Plan  1. Medication Adjustment    - Reduce duloxetine to 30mg  for one to two weeks, then discontinue.  2. Testing and Referrals    - Get a testosterone level test during earliest AM hours possible.    - Attend a sleep medicine referral for potential sleep apnea evaluation.  3. Ankle Pain Management    - Receive a cortisone injection for ankle pain relief.    - Use diclofenac as needed for pain.    - Consider PRP or stem cell therapy for long-term management.    - Wear a lace-up ankle stabilizer for support and continue ankle exercises.  4. Follow-Up Actions    - Monitor mood and libido changes after adjusting medication.    - Keep track of any compensatory pain in the right leg and Achilles tendon.

## 2023-03-27 NOTE — Assessment & Plan Note (Signed)
 He reports a decrease in libido and a change in mood, which he attributes to his current medication, duloxetine. He feels it affects his sense of self. He has been on a consistent workout routine, which has improved his mood.  Mood Changes Reports feeling less like himself on duloxetine, with decreased libido and mood changes. Discussed potential impact of sleep apnea and low testosterone on mood. -Reduce duloxetine to 30mg  for one-two weeks, then discontinue. -Order testosterone level. -Refer to sleep medicine for evaluation of potential sleep apnea.

## 2023-03-27 NOTE — Assessment & Plan Note (Signed)
 He notes potential sleep issues, as indicated by non-restorative sleep patterns recorded by his watch, and a history of snoring. His father uses a CPAP machine for sleep apnea.  OSA concern -Referral placed to sleep medicine for further evaluation and management

## 2023-04-06 ENCOUNTER — Other Ambulatory Visit: Payer: Self-pay | Admitting: Family Medicine

## 2023-04-06 DIAGNOSIS — B002 Herpesviral gingivostomatitis and pharyngotonsillitis: Secondary | ICD-10-CM

## 2023-04-06 NOTE — Telephone Encounter (Signed)
 Requested Prescriptions  Pending Prescriptions Disp Refills   valACYclovir (VALTREX) 500 MG tablet [Pharmacy Med Name: VALACYCLOVIR HCL 500 MG TABLET] 90 tablet 0    Sig: TAKE 1 TABLET (500 MG TOTAL) BY MOUTH DAILY.     Antimicrobials:  Antiviral Agents - Anti-Herpetic Passed - 04/06/2023  3:24 PM      Passed - Valid encounter within last 12 months    Recent Outpatient Visits           6 months ago Localized osteoarthritis of ankle, left   [place] [facility], Ocie Bob, MD   11 months ago Annual physical exam   [facility], Ocie Bob, MD   1 year ago Arthralgia of left acromioclavicular joint   [place] [facility], Ocie Bob, MD   2 years ago Arthralgia of left acromioclavicular joint   [place] [facility] at [facility], Ocie Bob, MD   2 years ago Anxiety and depression   [facility] at [facility], Ocie Bob, MD       Future Appointments             In 3 weeks Ashley Royalty, Ocie Bob, MD [facility] & [facility] Medicine at [facility], [facility]

## 2023-04-07 ENCOUNTER — Encounter: Payer: Self-pay | Admitting: Sleep Medicine

## 2023-04-07 ENCOUNTER — Ambulatory Visit: Admitting: Sleep Medicine

## 2023-04-07 VITALS — BP 136/84 | HR 67 | Temp 97.6°F | Ht 69.0 in | Wt 237.6 lb

## 2023-04-07 DIAGNOSIS — R400 Somnolence: Secondary | ICD-10-CM

## 2023-04-07 DIAGNOSIS — G4733 Obstructive sleep apnea (adult) (pediatric): Secondary | ICD-10-CM

## 2023-04-07 DIAGNOSIS — R0683 Snoring: Secondary | ICD-10-CM

## 2023-04-07 DIAGNOSIS — Z6835 Body mass index (BMI) 35.0-35.9, adult: Secondary | ICD-10-CM

## 2023-04-07 DIAGNOSIS — E66812 Obesity, class 2: Secondary | ICD-10-CM

## 2023-04-07 NOTE — Patient Instructions (Signed)
 Marland Kitchen

## 2023-04-07 NOTE — Progress Notes (Signed)
 Name:Steve Simmons MRN: [id number] DOB: Jun 14, 1985   CHIEF COMPLAINT:  EXCESSIVE DAYTIME SLEEPINESS   HISTORY OF PRESENT ILLNESS:  Mr. Steve Simmons is a 38 y.o. w/ a h/o anxiety and obesity who present for c/o loud snoring and excessive daytime sleepiness which has been present for 1-2 years. Reports nocturnal awakenings due to nocturia, however does not have difficulty falling back to sleep. Reports a 10 lb weight gain over the last year. Admits to morning headaches, night sweats and dry mouth. Denies RLS symptoms, dream enactment, cataplexy, hypnagogic or hypnapompic hallucinations. Reports a family history of sleep apnea. Denies drowsy driving. Drinks 2-3 cups of coffee and 1 pre workout drink daily, occasional alcohol use, denies tobacco or illicit drug use.   Bedtime 9-10 pm Sleep onset 10 mins Rise time 5-7 am   EPWORTH SLEEP SCORE 4    04/07/2023   11:02 AM  Results of the Epworth flowsheet  Sitting and reading 1  Watching TV 0  Sitting, inactive in a public place (e.g. a theatre or a meeting) 1  As a passenger in a car for an hour without a break 0  Lying down to rest in the afternoon when circumstances permit 2  Sitting and talking to someone 0  Sitting quietly after a lunch without alcohol 0  In a car, while stopped for a few minutes in traffic 0  Total score 4     PAST MEDICAL HISTORY :   has a past medical history of Anxiety and Subluxation complex (vertebral) of rib cage.  has a past surgical history that includes Pseudoaneurysm repair (Right) and Wisdom tooth extraction (Bilateral). Prior to Admission medications   Medication Sig Start Date End Date Taking? Authorizing Provider  diclofenac (VOLTAREN) 75 MG EC tablet TAKE 1 TABLET BY MOUTH TWICE A DAY AS NEEDED 02/28/23  Yes Jerrol Banana, MD  DULoxetine (CYMBALTA) 30 MG capsule Take 1 capsule (30 mg total) by mouth daily. X 1-2 weeks then discontinue. 03/27/23  Yes Jerrol Banana, MD  hydrocortisone 2.5 %  cream Apply a thin layer to the affected area twice daily for 1-2 weeks, then taper off gradually. 06/02/22  Yes Jerrol Banana, MD  Multiple Vitamin (MULTIVITAMIN) tablet Take 1 tablet by mouth daily.   Yes [provider]  valACYclovir (VALTREX) 500 MG tablet TAKE 1 TABLET (500 MG TOTAL) BY MOUTH DAILY. 04/06/23  Yes Jerrol Banana, MD   No Known Allergies  FAMILY HISTORY:  family history includes Arthritis in his maternal grandmother and paternal grandfather; Diabetes in his father, maternal grandmother, and paternal grandfather; Early death in his maternal grandfather; Heart attack in his maternal grandfather; Hypertension in his mother; Obesity in his brother; Prostate cancer in his father. SOCIAL HISTORY:  reports that he has quit smoking. His smoking use included cigars. He quit smokeless tobacco use about 4 weeks ago.  His smokeless tobacco use included chew. He reports current alcohol use of about 2.0 standard drinks of alcohol per week. He reports that he does not use drugs.   Review of Systems:  Gen:  Denies  fever, sweats, chills weight loss  HEENT: Denies blurred vision, double vision, ear pain, eye pain, hearing loss, nose bleeds, sore throat Cardiac:  No dizziness, chest pain or heaviness, chest tightness,edema, No JVD Resp:   No cough, -sputum production, -shortness of breath,-wheezing, -hemoptysis,  Gi: Denies swallowing difficulty, stomach pain, nausea or vomiting, diarrhea, constipation, bowel incontinence Gu:  Denies bladder incontinence, burning  urine Ext:   Denies Joint pain, stiffness or swelling Skin: Denies  skin rash, easy bruising or bleeding or hives Endoc:  Denies polyuria, polydipsia , polyphagia or weight change Psych:   Denies depression, insomnia or hallucinations  Other:  All other systems negative  VITAL SIGNS: BP 136/84 (BP Location: Left Arm, Patient Position: Sitting, Cuff Size: Normal)   Pulse 67   Temp 97.6 F (36.4 C) (Temporal)   Ht  5\' 9"  (1.753 m)   Wt 237 lb 9.6 oz (107.8 kg)   SpO2 97%   BMI 35.09 kg/m    Physical Examination:   General Appearance: No distress  EYES PERRLA, EOM intact.   NECK Supple, No JVD Pulmonary: normal breath sounds, No wheezing.  CardiovascularNormal S1,S2.  No m/r/g.   Abdomen: Benign, Soft, non-tender. Skin:   warm, no rashes, no ecchymosis  Extremities: normal, no cyanosis, clubbing. Neuro:without focal findings,  speech normal  PSYCHIATRIC: Mood, affect within normal limits.   ASSESSMENT AND PLAN  OSA I suspect that OSA is likely present due to clinical presentation. Discussed the consequences of untreated sleep apnea. Advised not to drive drowsy for safety of patient and others. Will complete further evaluation with a home sleep study and follow up to review results.    Obesity Counseled patient on diet and lifestyle modification.    MEDICATION ADJUSTMENTS/LABS AND TESTS ORDERED: Recommend Sleep Study   Patient  satisfied with Plan of action and management. All questions answered  Follow up to review HST results and treatment plan.   I spent a total of 33 minutes reviewing chart data, face-to-face evaluation with the patient, counseling and coordination of care as detailed above.    Tempie Hoist, M.D.  Sleep Medicine [facility]

## 2023-04-26 ENCOUNTER — Telehealth: Payer: Self-pay

## 2023-04-26 NOTE — Telephone Encounter (Signed)
 Copied from CRM [id number]. Topic: General - Other >> Apr 26, 2023  8:30 AM Steve Simmons wrote: Reason for CRM: As requested by the clinic - Patient is calling to advise that he completed his sleep study at home and has shipped off his test to [facility] on Saturday (04/22/2023).

## 2023-04-27 ENCOUNTER — Encounter

## 2023-04-27 DIAGNOSIS — G4733 Obstructive sleep apnea (adult) (pediatric): Secondary | ICD-10-CM

## 2023-05-02 ENCOUNTER — Encounter: Payer: Self-pay | Admitting: Family Medicine

## 2023-05-04 NOTE — Telephone Encounter (Signed)
 Appt has been scheduled for 4/10.   Nothing further needed.

## 2023-05-10 ENCOUNTER — Telehealth: Payer: Self-pay

## 2023-05-10 NOTE — Telephone Encounter (Signed)
 Lmtcb

## 2023-05-10 NOTE — Telephone Encounter (Signed)
 Copied from CRM [id number]. Topic: Clinical - Medical Advice >> May 10, 2023 12:14 PM Payton Doughty wrote: Reason for CRM: pt had sleep study approx 2 weeks ago.  Pt has confirmation a cpap is needed.  What would be his next steps?  Pt had appt for tomorrow, but really no need at this point. Please call pt at [phone]

## 2023-05-11 ENCOUNTER — Ambulatory Visit: Admitting: Sleep Medicine

## 2023-05-11 ENCOUNTER — Telehealth: Payer: Self-pay

## 2023-05-11 DIAGNOSIS — G4733 Obstructive sleep apnea (adult) (pediatric): Secondary | ICD-10-CM

## 2023-05-11 NOTE — Telephone Encounter (Signed)
 Patient requested to have CPAP order placed. Appt for today canceled. DME order placed. 3 mth fu appt scheduled. NFN.

## 2023-05-16 NOTE — Telephone Encounter (Signed)
 See telephone encounter from 4/10,  Patient requested to have CPAP order placed. Appt for today canceled. DME order placed. 3 mth fu appt scheduled. NFN.       Nothing further needed.

## 2023-05-19 ENCOUNTER — Other Ambulatory Visit: Payer: Self-pay | Admitting: Family Medicine

## 2023-05-19 DIAGNOSIS — F419 Anxiety disorder, unspecified: Secondary | ICD-10-CM

## 2023-05-22 NOTE — Telephone Encounter (Signed)
 Please review. Pt wants to go back to 60 MG.  KP

## 2023-05-23 ENCOUNTER — Other Ambulatory Visit: Payer: Self-pay | Admitting: Family Medicine

## 2023-05-23 DIAGNOSIS — F32A Depression, unspecified: Secondary | ICD-10-CM

## 2023-05-23 MED ORDER — SERTRALINE HCL 50 MG PO TABS: 50.0000 mg | ORAL_TABLET | Freq: Every day | ORAL | 1 refills | Status: DC

## 2023-05-23 NOTE — Telephone Encounter (Signed)
 Please review. Pts message below.  KP

## 2023-05-28 ENCOUNTER — Other Ambulatory Visit: Payer: Self-pay | Admitting: Family Medicine

## 2023-05-28 DIAGNOSIS — M19072 Primary osteoarthritis, left ankle and foot: Secondary | ICD-10-CM

## 2023-05-30 NOTE — Telephone Encounter (Signed)
 Requested medication (s) are due for refill today: yes  Requested medication (s) are on the active medication list: yes  Last refill:  02/28/23  Future visit scheduled: no  Notes to clinic:  Unable to refill per protocol due to failed labs, no updated results.      Requested Prescriptions  Pending Prescriptions Disp Refills   diclofenac  (VOLTAREN ) 75 MG EC tablet [Pharmacy Med Name: DICLOFENAC  SOD EC 75 MG TAB] 180 tablet 0    Sig: TAKE 1 TABLET BY MOUTH TWICE A DAY AS NEEDED     Analgesics:  NSAIDS Failed - 05/30/2023 11:08 AM      Failed - Manual Review: Labs are only required if the patient has taken medication for more than 8 weeks.      Failed - Cr in normal range and within 360 days    Creatinine, Ser  Date Value Ref Range Status  05/04/2022 1.21 0.76 - 1.27 mg/dL Final         Failed - HGB in normal range and within 360 days    Hemoglobin  Date Value Ref Range Status  05/04/2022 15.0 13.0 - 17.7 g/dL Final         Failed - PLT in normal range and within 360 days    Platelets  Date Value Ref Range Status  05/04/2022 230 150 - 450 x10E3/uL Final         Failed - HCT in normal range and within 360 days    Hematocrit  Date Value Ref Range Status  05/04/2022 43.3 37.5 - 51.0 % Final         Failed - eGFR is 30 or above and within 360 days    eGFR  Date Value Ref Range Status  05/04/2022 79 >59 mL/min/1.73 Final         Failed - Valid encounter within last 12 months    Recent Outpatient Visits           2 months ago Localized osteoarthritis of ankle, left   [place] [facility], Dessie Flow, MD              Passed - Patient is not pregnant

## 2023-06-16 DIAGNOSIS — G4733 Obstructive sleep apnea (adult) (pediatric): Secondary | ICD-10-CM | POA: Diagnosis not present

## 2023-06-23 ENCOUNTER — Other Ambulatory Visit: Payer: Self-pay | Admitting: Family Medicine

## 2023-06-23 DIAGNOSIS — B002 Herpesviral gingivostomatitis and pharyngotonsillitis: Secondary | ICD-10-CM

## 2023-06-27 NOTE — Telephone Encounter (Signed)
 Requested Prescriptions  Pending Prescriptions Disp Refills   valACYclovir  (VALTREX ) 500 MG tablet [Pharmacy Med Name: VALACYCLOVIR  HCL 500 MG TABLET] 90 tablet 0    Sig: TAKE 1 TABLET (500 MG TOTAL) BY MOUTH DAILY.     Antimicrobials:  Antiviral Agents - Anti-Herpetic Failed - 06/27/2023 12:00 PM      Failed - Valid encounter within last 12 months    Recent Outpatient Visits           3 months ago Localized osteoarthritis of ankle, left   [facility] at [facility], Dessie Flow, MD

## 2023-07-17 DIAGNOSIS — G4733 Obstructive sleep apnea (adult) (pediatric): Secondary | ICD-10-CM | POA: Diagnosis not present

## 2023-07-27 ENCOUNTER — Ambulatory Visit: Admitting: Podiatry

## 2023-07-27 DIAGNOSIS — M19072 Primary osteoarthritis, left ankle and foot: Secondary | ICD-10-CM | POA: Diagnosis not present

## 2023-07-27 DIAGNOSIS — M25872 Other specified joint disorders, left ankle and foot: Secondary | ICD-10-CM | POA: Diagnosis not present

## 2023-07-27 DIAGNOSIS — M25579 Pain in unspecified ankle and joints of unspecified foot: Secondary | ICD-10-CM | POA: Insufficient documentation

## 2023-07-27 MED ORDER — METHYLPREDNISOLONE 4 MG PO TBPK: ORAL_TABLET | ORAL | 0 refills | Status: AC

## 2023-07-27 MED ORDER — MELOXICAM 15 MG PO TABS: 15.0000 mg | ORAL_TABLET | Freq: Every day | ORAL | 0 refills | Status: DC

## 2023-07-27 NOTE — Progress Notes (Signed)
 Subjective:  Patient ID: Steve Simmons, male    DOB: [birth year],  MRN: [id number]  Chief Complaint  Patient presents with   Foot Pain    Pt stated that he has been dealing with this for a few years he stated that he has had previous MRI and stuff done on his ankle in the past. He stated that he would like to be able to run again without his foot going numb.     38 y.o. male presents with the above complaint.  Patient presents with chronic pain to the left foot.  Patient states been going on for few weeks has progressively gotten worse.  He went to [facility] where he had MRI done.  He has had stuff done to his ankle in the past.  He would like to be able to run again without the numbness and the pain.  He would like to discuss treatment options pain scale is 5 out of 10 dull achy in nature.  He has brought his MRI CD with him.  He went to [facility] where he had the MRI done.  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    Review of Systems: Negative except as noted in the HPI. Denies N/V/F/Ch.  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    Current Outpatient Medications:    meloxicam  (MOBIC ) 15 MG tablet, Take 1 tablet (15 mg total) by mouth daily., Disp: 30 tablet, Rfl: 0   methylPREDNISolone  (MEDROL  DOSEPAK) 4 MG TBPK tablet, Take as directed, Disp: 21 each, Rfl: 0   diclofenac  (VOLTAREN ) 75 MG EC tablet, TAKE 1 TABLET BY MOUTH TWICE A DAY AS NEEDED, Disp: 180 tablet, Rfl: 0   hydrocortisone  2.5 % cream, Apply a thin layer to the affected area twice daily for 1-2 weeks, then taper off gradually., Disp: 3.5 g, Rfl: 1   Multiple Vitamin (MULTIVITAMIN) tablet, Take 1 tablet by mouth daily., Disp: , Rfl:    sertraline  (ZOLOFT ) 50 MG tablet, Take 1 tablet (50 mg total) by mouth daily., Disp: 60 tablet, Rfl: 1   valACYclovir  (VALTREX )  500 MG tablet, TAKE 1 TABLET (500 MG TOTAL) BY MOUTH DAILY., Disp: 90 tablet, Rfl: 0  Social History   Tobacco Use  Smoking Status Former   Types: Cigars  Smokeless Tobacco Former   Types: Chew   Quit date: 03/04/2023  Tobacco Comments   I have used chewing tobacco/snuff off and on for 10 years.    No Known Allergies Objective:  There were no vitals filed for this visit. There is no height or weight on file to calculate BMI. Constitutional Well developed. Well nourished.  Vascular Dorsalis pedis pulses palpable bilaterally. Posterior tibial pulses palpable bilaterally. Capillary refill normal to all digits.  No cyanosis or clubbing noted. Pedal hair growth normal.  Neurologic Normal speech. Oriented to person, place, and time. Epicritic sensation to light touch grossly present bilaterally.  Dermatologic Nails well groomed and normal in appearance. No open wounds. No skin lesions.  Orthopedic: Pain on palpation to the posterior ankle as well as the subtalar joint pain with range of motion of the subtalar joint.  Crepitus clinically appreciated the subtalar joint.  Pain is deep ankle pain in the posterior ankle.  No pain at the Achilles tendon no pain of the posterior tibial tendon no pain of the peroneal tendon.  Radiographs: emonstrates a large fragment in the posterior ankle and subtalar arthritic change. There is a large posterior process of talus and large os trigonium, which is very arthritic, likely from an old fracture.  Assessment:   1. Arthritis of left subtalar joint   2. Ankle impingement syndrome, left    Plan:  Patient was evaluated and treated and all questions answered.  Left subtalar joint severe arthritis/posterior ankle impingement - All questions or concerns were discussed with the patient in extensive detail given the amount of pain that he is experiencing he will benefit from CT scan to assess the subtalar joint as well as the other joint.  MRI did  confirm diagnosis of arthritis of the subtalar joint as well as synovitis of the ankle however I am unable to appreciate the severity of the arthritis.  For this I need a CT scan - CT scan order was placed. - Patient would likely need surgical intervention in the future - Will hold off on any further steroid shot as patient has had not good success with it at [facility]

## 2023-08-10 ENCOUNTER — Ambulatory Visit: Admitting: Sleep Medicine

## 2023-08-16 DIAGNOSIS — G4733 Obstructive sleep apnea (adult) (pediatric): Secondary | ICD-10-CM | POA: Diagnosis not present

## 2023-08-17 ENCOUNTER — Ambulatory Visit: Admitting: Podiatry

## 2023-08-18 ENCOUNTER — Encounter: Payer: Self-pay | Admitting: Advanced Practice Midwife

## 2023-08-26 ENCOUNTER — Other Ambulatory Visit: Payer: Self-pay | Admitting: Podiatry

## 2023-08-30 ENCOUNTER — Other Ambulatory Visit: Payer: Self-pay | Admitting: Family Medicine

## 2023-08-30 DIAGNOSIS — M19072 Primary osteoarthritis, left ankle and foot: Secondary | ICD-10-CM

## 2023-08-31 ENCOUNTER — Ambulatory Visit

## 2023-08-31 NOTE — Telephone Encounter (Signed)
 Requested medication (s) are due for refill today: yes  Requested medication (s) are on the active medication list: yes  Last refill:  05/30/23 #180  Future visit scheduled: no  Notes to clinic:  overdue lab work    Requested Prescriptions  Pending Prescriptions Disp Refills   diclofenac  (VOLTAREN ) 75 MG EC tablet [Pharmacy Med Name: DICLOFENAC  SOD EC 75 MG TAB] 180 tablet 0    Sig: TAKE 1 TABLET BY MOUTH TWICE A DAY AS NEEDED     Analgesics:  NSAIDS Failed - 08/31/2023  7:51 AM      Failed - Manual Review: Labs are only required if the patient has taken medication for more than 8 weeks.      Failed - Cr in normal range and within 360 days    Creatinine, Ser  Date Value Ref Range Status  05/04/2022 1.21 0.76 - 1.27 mg/dL Final         Failed - HGB in normal range and within 360 days    Hemoglobin  Date Value Ref Range Status  05/04/2022 15.0 13.0 - 17.7 g/dL Final         Failed - PLT in normal range and within 360 days    Platelets  Date Value Ref Range Status  05/04/2022 230 150 - 450 x10E3/uL Final         Failed - HCT in normal range and within 360 days    Hematocrit  Date Value Ref Range Status  05/04/2022 43.3 37.5 - 51.0 % Final         Failed - eGFR is 30 or above and within 360 days    eGFR  Date Value Ref Range Status  05/04/2022 79 >59 mL/min/1.73 Final         Failed - Valid encounter within last 12 months    Recent Outpatient Visits           5 months ago Localized osteoarthritis of ankle, left   [place] [facility], Selinda PARAS, MD              Passed - Patient is not pregnant

## 2023-09-04 ENCOUNTER — Ambulatory Visit

## 2023-09-04 ENCOUNTER — Telehealth: Payer: Self-pay | Admitting: Podiatry

## 2023-09-04 NOTE — Telephone Encounter (Signed)
 Patient needs authorization for CT scan  that will be done on 09/11/23.

## 2023-09-05 ENCOUNTER — Ambulatory Visit: Admitting: Podiatry

## 2023-09-06 ENCOUNTER — Ambulatory Visit: Admitting: Sleep Medicine

## 2023-09-11 ENCOUNTER — Ambulatory Visit
Admission: RE | Admit: 2023-09-11 | Discharge: 2023-09-11 | Disposition: A | Discharge status: Home / Self Care | Source: Ambulatory Visit | Attending: Podiatry | Admitting: Podiatry

## 2023-09-11 ENCOUNTER — Ambulatory Visit: Admitting: Sleep Medicine

## 2023-09-11 ENCOUNTER — Encounter: Payer: Self-pay | Admitting: Sleep Medicine

## 2023-09-11 VITALS — BP 100/70 | HR 56 | Temp 97.9°F | Ht 69.0 in | Wt 243.4 lb

## 2023-09-11 DIAGNOSIS — M7732 Calcaneal spur, left foot: Secondary | ICD-10-CM | POA: Diagnosis not present

## 2023-09-11 DIAGNOSIS — G4733 Obstructive sleep apnea (adult) (pediatric): Secondary | ICD-10-CM | POA: Diagnosis not present

## 2023-09-11 DIAGNOSIS — E66812 Obesity, class 2: Secondary | ICD-10-CM

## 2023-09-11 DIAGNOSIS — Z01818 Encounter for other preprocedural examination: Secondary | ICD-10-CM | POA: Diagnosis not present

## 2023-09-11 DIAGNOSIS — M2540 Effusion, unspecified joint: Secondary | ICD-10-CM | POA: Diagnosis not present

## 2023-09-11 DIAGNOSIS — Z6835 Body mass index (BMI) 35.0-35.9, adult: Secondary | ICD-10-CM | POA: Diagnosis not present

## 2023-09-11 DIAGNOSIS — E669 Obesity, unspecified: Secondary | ICD-10-CM

## 2023-09-11 DIAGNOSIS — M19072 Primary osteoarthritis, left ankle and foot: Secondary | ICD-10-CM | POA: Insufficient documentation

## 2023-09-11 DIAGNOSIS — Z87891 Personal history of nicotine dependence: Secondary | ICD-10-CM

## 2023-09-11 DIAGNOSIS — Z9989 Dependence on other enabling machines and devices: Secondary | ICD-10-CM

## 2023-09-11 NOTE — Patient Instructions (Addendum)

## 2023-09-11 NOTE — Progress Notes (Signed)
 Name:Steve Simmons MRN: [id number] DOB: Dec 30, 1985   CHIEF COMPLAINT:  CPAP F/U   HISTORY OF PRESENT ILLNESS:  Steve Simmons is a 38 y.o. w/ a h/o OSA and obesity who presents for CPAP F/U visit. Reports using CPAP therapy every night, which is confirmed by compliance data. He is currently using the Airfit F20 FFM, which is comfortable. Denies air leaks or nasal congestion. Also reports feeling more refreshed upon awakening with CPAP therapy.    EPWORTH SLEEP SCORE    04/07/2023   11:02 AM  Results of the Epworth flowsheet  Sitting and reading 1  Watching TV 0  Sitting, inactive in a public place (e.g. a theatre or a meeting) 1  As a passenger in a car for an hour without a break 0  Lying down to rest in the afternoon when circumstances permit 2  Sitting and talking to someone 0  Sitting quietly after a lunch without alcohol 0  In a car, while stopped for a few minutes in traffic 0  Total score 4    PAST MEDICAL HISTORY :   has a past medical history of Anxiety and Subluxation complex (vertebral) of rib cage.  has a past surgical history that includes Pseudoaneurysm repair (Right) and Wisdom tooth extraction (Bilateral). Prior to Admission medications   Medication Sig Start Date End Date Taking? Authorizing Provider  diclofenac  (VOLTAREN ) 75 MG EC tablet TAKE 1 TABLET BY MOUTH TWICE A DAY AS NEEDED 08/31/23  Yes Steve Selinda PARAS, MD  hydrocortisone  2.5 % cream Apply a thin layer to the affected area twice daily for 1-2 weeks, then taper off gradually. 06/02/22  Yes Steve Selinda PARAS, MD  meloxicam  (MOBIC ) 15 MG tablet TAKE 1 TABLET (15 MG TOTAL) BY MOUTH DAILY. 08/28/23  Yes Steve Simmons, DPM  methylPREDNISolone  (MEDROL  DOSEPAK) 4 MG TBPK tablet Take as directed 07/27/23  Yes Steve Simmons, DPM  Multiple Vitamin (MULTIVITAMIN) tablet Take 1 tablet by mouth daily.   Yes [provider]  sertraline  (ZOLOFT ) 50 MG tablet Take 1 tablet (50 mg total) by mouth daily.  05/23/23  Yes Steve Selinda PARAS, MD  valACYclovir  (VALTREX ) 500 MG tablet TAKE 1 TABLET (500 MG TOTAL) BY MOUTH DAILY. 06/27/23  Yes Steve Selinda PARAS, MD   No Known Allergies  FAMILY HISTORY:  family history includes Arthritis in his maternal grandmother and paternal grandfather; Diabetes in his father, maternal grandmother, and paternal grandfather; Early death in his maternal grandfather; Heart attack in his maternal grandfather; Hypertension in his mother; Obesity in his brother; Prostate cancer in his father. SOCIAL HISTORY:  reports that he has quit smoking. His smoking use included cigars. He quit smokeless tobacco use about 6 months ago.  His smokeless tobacco use included chew. He reports current alcohol use of about 2.0 standard drinks of alcohol per week. He reports that he does not use drugs.   Review of Systems:  Gen:  Denies  fever, sweats, chills weight loss  HEENT: Denies blurred vision, double vision, ear pain, eye pain, hearing loss, nose bleeds, sore throat Cardiac:  No dizziness, chest pain or heaviness, chest tightness,edema, No JVD Resp:   No cough, -sputum production, -shortness of breath,-wheezing, -hemoptysis,  Gi: Denies swallowing difficulty, stomach pain, nausea or vomiting, diarrhea, constipation, bowel incontinence Gu:  Denies bladder incontinence, burning urine Ext:   Denies Joint pain, stiffness or swelling Skin: Denies  skin rash, easy bruising or bleeding or hives Endoc:  Denies polyuria, polydipsia ,  polyphagia or weight change Psych:   Denies depression, insomnia or hallucinations  Other:  All other systems negative  VITAL SIGNS: BP 100/70 (BP Location: Right Arm, Patient Position: Sitting, Cuff Size: Large)   Pulse (!) 56   Temp 97.9 F (36.6 C) (Oral)   Ht 5' 9 (1.753 m)   Wt 243 lb 6.4 oz (110.4 kg)   SpO2 97%   BMI 35.94 kg/m    Physical Examination:   General Appearance: No distress  EYES PERRLA, EOM intact.   NECK Supple, No  JVD Pulmonary: normal breath sounds, No wheezing.  CardiovascularNormal S1,S2.  No m/r/g.   Abdomen: Benign, Soft, non-tender. Skin:   warm, no rashes, no ecchymosis  Extremities: normal, no cyanosis, clubbing. Neuro:without focal findings,  speech normal  PSYCHIATRIC: Mood, affect within normal limits.   ASSESSMENT AND PLAN  OSA Patient is using and benefiting from CPAP therapy. Discussed the consequences of untreated sleep apnea. Advised not to drive drowsy for safety of patient and others. Will follow up in 6 months.    Obesity Counseled patient on diet and lifestyle modification.    Patient  satisfied with Plan of action and management. All questions answered  I spent a total of 20 minutes reviewing chart data, face-to-face evaluation with the patient, counseling and coordination of care as detailed above.    Steve Simmons, M.D.  Sleep Medicine  Pulmonary & Critical Care Medicine

## 2023-09-14 ENCOUNTER — Other Ambulatory Visit: Payer: Self-pay | Admitting: Family Medicine

## 2023-09-14 DIAGNOSIS — F32A Depression, unspecified: Secondary | ICD-10-CM

## 2023-09-16 DIAGNOSIS — G4733 Obstructive sleep apnea (adult) (pediatric): Secondary | ICD-10-CM | POA: Diagnosis not present

## 2023-09-18 DIAGNOSIS — G4733 Obstructive sleep apnea (adult) (pediatric): Secondary | ICD-10-CM | POA: Diagnosis not present

## 2023-09-18 DIAGNOSIS — I10 Essential (primary) hypertension: Secondary | ICD-10-CM | POA: Diagnosis not present

## 2023-09-18 NOTE — Telephone Encounter (Signed)
 OFFICE VISIT NEEDED FOR ADDITIONAL REFILLS   Requested Prescriptions  Pending Prescriptions Disp Refills   sertraline  (ZOLOFT ) 50 MG tablet [Pharmacy Med Name: SERTRALINE  HCL 50 MG TABLET] 90 tablet 0    Sig: TAKE 1 TABLET BY MOUTH EVERY DAY     Psychiatry:  Antidepressants - SSRI - sertraline  Failed - 09/18/2023  9:23 AM      Failed - AST in normal range and within 360 days    AST  Date Value Ref Range Status  05/04/2022 38 0 - 40 IU/L Final         Failed - ALT in normal range and within 360 days    ALT  Date Value Ref Range Status  05/04/2022 36 0 - 44 IU/L Final         Failed - Valid encounter within last 6 months    Recent Outpatient Visits           5 months ago Localized osteoarthritis of ankle, left   [place] [facility] at [facility] Steve Simmons, Steve PARAS, MD              Passed - Completed PHQ-2 or PHQ-9 in the last 360 days

## 2023-09-19 ENCOUNTER — Ambulatory Visit: Admitting: Podiatry

## 2023-09-19 DIAGNOSIS — M19072 Primary osteoarthritis, left ankle and foot: Secondary | ICD-10-CM

## 2023-09-19 DIAGNOSIS — M25872 Other specified joint disorders, left ankle and foot: Secondary | ICD-10-CM

## 2023-09-19 NOTE — Progress Notes (Signed)
 Subjective:  Patient ID: Steve Simmons, male    DOB: January 27, 1986,  MRN: [id number]  Chief Complaint  Patient presents with   Arthritis    Pt is here to go over CT results     38 y.o. male presents with the above complaint.  Patient presents with chronic pain to the left foot.  Patient states been going on for few weeks has progressively gotten worse.  He went to [facility] where he had MRI done.  He has had stuff done to his ankle in the past.  He would like to be able to run again without the numbness and the pain.  He would like to discuss treatment options pain scale is 5 out of 10 dull achy in nature.  He has brought his MRI CD with him.  He went to [facility] where he had the MRI done.  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    Review of Systems: Negative except as noted in the HPI. Denies N/V/F/Ch.  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    Current Outpatient Medications:    diclofenac  (VOLTAREN ) 75 MG EC tablet, TAKE 1 TABLET BY MOUTH TWICE A DAY AS NEEDED, Disp: 180 tablet, Rfl: 0   hydrocortisone  2.5 % cream, Apply a thin layer to the affected area twice daily for 1-2 weeks, then taper off gradually., Disp: 3.5 g, Rfl: 1   meloxicam  (MOBIC ) 15 MG tablet, TAKE 1 TABLET (15 MG TOTAL) BY MOUTH DAILY., Disp: 30 tablet, Rfl: 0   methylPREDNISolone  (MEDROL  DOSEPAK) 4 MG TBPK tablet, Take as directed, Disp: 21 each, Rfl: 0   Multiple Vitamin (MULTIVITAMIN) tablet, Take 1 tablet by mouth daily., Disp: , Rfl:    sertraline  (ZOLOFT ) 50 MG tablet, TAKE 1 TABLET BY MOUTH EVERY DAY, Disp: 90 tablet, Rfl: 0   valACYclovir  (VALTREX ) 500 MG tablet, TAKE 1 TABLET (500 MG TOTAL) BY MOUTH DAILY., Disp: 90 tablet, Rfl: 0  Social History   Tobacco Use  Smoking Status Former   Types: Cigars  Smokeless Tobacco Former   Types:  Chew   Quit date: 03/04/2023  Tobacco Comments   I have used chewing tobacco/snuff off and on for 10 years.    No Known Allergies Objective:  There were no vitals filed for this visit. There is no height or weight on file to calculate BMI. Constitutional Well developed. Well nourished.  Vascular Dorsalis pedis pulses palpable bilaterally. Posterior tibial pulses palpable bilaterally. Capillary refill normal to all digits.  No cyanosis or clubbing noted. Pedal hair growth normal.  Neurologic Normal speech. Oriented to person, place, and time. Epicritic sensation to light touch grossly present bilaterally.  Dermatologic Nails well groomed and normal in appearance. No open wounds. No skin lesions.  Orthopedic: Pain on palpation to the posterior ankle as well as the subtalar joint pain with range of motion of the subtalar joint.  Crepitus clinically appreciated the subtalar joint.  Pain is deep ankle pain in the posterior ankle.  No pain at the Achilles tendon no pain of the posterior tibial tendon no pain of the peroneal tendon.   Radiographs: emonstrates a large fragment in the posterior ankle and subtalar arthritic change. There is a large posterior process of talus and large os trigonium, which is very arthritic, likely from an old fracture.   IMPRESSION: 1. Severe osteoarthritis of  the subtalar joints. Moderate subtalar joint effusion with a 18 mm loose body posterior to the posterior subtalar joint. 2. Mild osteoarthritis of the talonavicular joint. 3. Mild osteoarthritis of the tibiotalar joint with a 2 mm loose body laterally.   Assessment:   1. Arthritis of left subtalar joint   2. Ankle impingement syndrome, left     Plan:  Patient was evaluated and treated and all questions answered.  Left subtalar joint severe arthritis/posterior ankle impingement - All questions or concerns were discussed with the patient in extensive detail given the patient CT scan results show  severe osteoarthritis to the left subtalar joint without any other joint involvement I believe patient would benefit from primary arthrodesis of the subtalar joint left side.  I discussed my preoperative plan in extensive detail he states understanding and would like to proceed with surgery.  Clinically he does not any pain in any of the other joints circumferential around the foot and ankle. - I discussed my preoperative or postop plan with the patient in extensive detail he states understanding like to proceed with surgery. -Informed surgical risk consent was reviewed and read aloud to the patient.  I reviewed the films.  I have discussed my findings with the patient in great detail.  I have discussed all risks including but not limited to infection, stiffness, scarring, limp, disability, deformity, damage to blood vessels and nerves, numbness, poor healing, need for braces, arthritis, chronic pain, amputation, death.  All benefits and realistic expectations discussed in great detail.  I have made no promises as to the outcome.  I have provided realistic expectations.  I have offered the patient a 2nd opinion, which they have declined and assured me they preferred to proceed despite the risks

## 2023-09-23 ENCOUNTER — Other Ambulatory Visit: Payer: Self-pay | Admitting: Family Medicine

## 2023-09-23 DIAGNOSIS — B002 Herpesviral gingivostomatitis and pharyngotonsillitis: Secondary | ICD-10-CM

## 2023-09-25 NOTE — Telephone Encounter (Signed)
 Ov 03/27/23- non acute Requested Prescriptions  Pending Prescriptions Disp Refills   valACYclovir  (VALTREX ) 500 MG tablet [Pharmacy Med Name: VALACYCLOVIR  HCL 500 MG TABLET] 90 tablet 0    Sig: TAKE 1 TABLET (500 MG TOTAL) BY MOUTH DAILY.     Antimicrobials:  Antiviral Agents - Anti-Herpetic Failed - 09/25/2023  2:27 PM      Failed - Valid encounter within last 12 months    Recent Outpatient Visits           6 months ago Localized osteoarthritis of ankle, left   [facility] at [facility], Selinda PARAS, MD

## 2023-10-10 ENCOUNTER — Telehealth: Payer: Self-pay | Admitting: Podiatry

## 2023-10-10 NOTE — Telephone Encounter (Signed)
 Received surgical consent form  Left message for pt to call to get scheduled for surgery.

## 2023-10-12 ENCOUNTER — Ambulatory Visit: Payer: Self-pay | Admitting: Podiatry

## 2023-10-12 NOTE — Telephone Encounter (Signed)
 Pt called back and left message about scheduling surgery he was returning my call.   He would like to discuss with his wife about timing possibly oct or nov and will call me back to schedule the surgery.

## 2023-10-17 DIAGNOSIS — G4733 Obstructive sleep apnea (adult) (pediatric): Secondary | ICD-10-CM | POA: Diagnosis not present

## 2023-10-18 ENCOUNTER — Telehealth: Payer: Self-pay | Admitting: Podiatry

## 2023-10-18 NOTE — Telephone Encounter (Signed)
 Patient called in to schedule surgery and wanted to know the cost breakdown for self pay. Thanks.

## 2023-10-19 ENCOUNTER — Telehealth: Payer: Self-pay | Admitting: Podiatry

## 2023-10-19 NOTE — Telephone Encounter (Signed)
 Pt called and left message stating he was wanting to discuss surgery he needs to be scheduled for with Dr Tobie.  I returned call and he is asking if it would be less expensive to go with his insurance verses his wives insurance thru the state.  I explained we could give estimate for current insurance and I would send to surgery center as well. As he will get 3 bills one from the doctor, one from the surgery center and one from the anesthesiologist.  As far as what his wives state plan they have different levels so we would not be able to give estimate on that.  Please call pt with estimate  cpt [phone].  I will send request to the surgery center as well.

## 2023-10-19 NOTE — Telephone Encounter (Signed)
 Left message for pt to call to discuss scheduling surgery.

## 2023-10-26 ENCOUNTER — Telehealth: Payer: Self-pay | Admitting: Podiatry

## 2023-10-26 ENCOUNTER — Telehealth: Payer: Self-pay | Admitting: Lab

## 2023-10-26 DIAGNOSIS — Z0279 Encounter for issue of other medical certificate: Secondary | ICD-10-CM

## 2023-10-26 NOTE — Telephone Encounter (Signed)
 lft mess to on his vmail and was cut off, but will bill 25.00 since we had form prior to new policy. I adv wld fax Jessica [phone] but if any other forms recd he wld have to pay fee first.emailing him copy  Did adv if any other forms come he would have to pay 25.00 prior to completion

## 2023-10-26 NOTE — Telephone Encounter (Signed)
 Verifying if there is a DOS?

## 2023-10-26 NOTE — Telephone Encounter (Signed)
 Patient left message stating he has sent paperwork to be filled out via my chart needed for [employer] needing urgent.

## 2023-11-29 ENCOUNTER — Other Ambulatory Visit: Payer: Self-pay | Admitting: Family Medicine

## 2023-11-29 DIAGNOSIS — M19072 Primary osteoarthritis, left ankle and foot: Secondary | ICD-10-CM

## 2023-11-30 NOTE — Telephone Encounter (Signed)
 Please review.  KP

## 2023-11-30 NOTE — Telephone Encounter (Signed)
 Requested medication (s) are due for refill today: Yes  Requested medication (s) are on the active medication list: Yes  Last refill:  08/31/23  Future visit scheduled: No  Notes to clinic:  Manual review.    Requested Prescriptions  Pending Prescriptions Disp Refills   diclofenac  (VOLTAREN ) 75 MG EC tablet [Pharmacy Med Name: DICLOFENAC  SOD EC 75 MG TAB] 180 tablet 0    Sig: TAKE 1 TABLET BY MOUTH TWICE A DAY AS NEEDED     Analgesics:  NSAIDS Failed - 11/30/2023  2:23 PM      Failed - Manual Review: Labs are only required if the patient has taken medication for more than 8 weeks.      Failed - Cr in normal range and within 360 days    Creatinine, Ser  Date Value Ref Range Status  05/04/2022 1.21 0.76 - 1.27 mg/dL Final         Failed - HGB in normal range and within 360 days    Hemoglobin  Date Value Ref Range Status  05/04/2022 15.0 13.0 - 17.7 g/dL Final         Failed - PLT in normal range and within 360 days    Platelets  Date Value Ref Range Status  05/04/2022 230 150 - 450 x10E3/uL Final         Failed - HCT in normal range and within 360 days    Hematocrit  Date Value Ref Range Status  05/04/2022 43.3 37.5 - 51.0 % Final         Failed - eGFR is 30 or above and within 360 days    eGFR  Date Value Ref Range Status  05/04/2022 79 >59 mL/min/1.73 Final         Failed - Valid encounter within last 12 months    Recent Outpatient Visits           8 months ago Localized osteoarthritis of ankle, left   [place] [facility], Selinda PARAS, MD              Passed - Patient is not pregnant

## 2023-12-01 NOTE — Telephone Encounter (Signed)
Please schedule pt appt for CPE.  KP

## 2023-12-05 ENCOUNTER — Telehealth: Payer: Self-pay | Admitting: Podiatry

## 2023-12-05 NOTE — Telephone Encounter (Signed)
 Patient contacted office to schedule surgery.Patient has been scheduled for 02/05/2024. Not on any blood thinner or GLP1 medications. Patient confirmed preferred pharmacy in chart. Patient aware [facility] will call 24-48 hours prior to surgery with arrival time.

## 2023-12-08 ENCOUNTER — Other Ambulatory Visit: Payer: Self-pay | Admitting: Family Medicine

## 2023-12-08 DIAGNOSIS — B002 Herpesviral gingivostomatitis and pharyngotonsillitis: Secondary | ICD-10-CM

## 2023-12-09 NOTE — Telephone Encounter (Signed)
 OFFICE VISIT NEEDED FOR ADDITIONAL REFILLS   Requested Prescriptions  Pending Prescriptions Disp Refills   valACYclovir  (VALTREX ) 500 MG tablet [Pharmacy Med Name: VALACYCLOVIR  HCL 500 MG TABLET] 30 tablet 0    Sig: TAKE 1 TABLET (500 MG TOTAL) BY MOUTH DAILY.     Antimicrobials:  Antiviral Agents - Anti-Herpetic Failed - 12/09/2023  9:34 AM      Failed - Valid encounter within last 12 months    Recent Outpatient Visits           8 months ago Localized osteoarthritis of ankle, left   [facility] at [facility], Selinda PARAS, MD

## 2023-12-14 ENCOUNTER — Other Ambulatory Visit: Payer: Self-pay | Admitting: Family Medicine

## 2023-12-14 DIAGNOSIS — F32A Depression, unspecified: Secondary | ICD-10-CM

## 2023-12-15 NOTE — Telephone Encounter (Signed)
 Requested medications are due for refill today.  yes  Requested medications are on the active medications list.  yes  Last refill. 09/18/2023 #90 0 rf  Future visit scheduled.   no  Notes to clinic.  Labs are expired    Requested Prescriptions  Pending Prescriptions Disp Refills   sertraline  (ZOLOFT ) 50 MG tablet [Pharmacy Med Name: SERTRALINE  HCL 50 MG TABLET] 90 tablet 0    Sig: TAKE 1 TABLET BY MOUTH EVERY DAY     Psychiatry:  Antidepressants - SSRI - sertraline  Failed - 12/15/2023  3:50 PM      Failed - AST in normal range and within 360 days    AST  Date Value Ref Range Status  05/04/2022 38 0 - 40 IU/L Final         Failed - ALT in normal range and within 360 days    ALT  Date Value Ref Range Status  05/04/2022 36 0 - 44 IU/L Final         Failed - Valid encounter within last 6 months    Recent Outpatient Visits           8 months ago Localized osteoarthritis of ankle, left   [place] [facility], Selinda PARAS, MD              Passed - Completed PHQ-2 or PHQ-9 in the last 360 days

## 2023-12-17 DIAGNOSIS — G4733 Obstructive sleep apnea (adult) (pediatric): Secondary | ICD-10-CM | POA: Diagnosis not present

## 2024-01-03 ENCOUNTER — Other Ambulatory Visit: Payer: Self-pay | Admitting: Family Medicine

## 2024-01-03 DIAGNOSIS — M19072 Primary osteoarthritis, left ankle and foot: Secondary | ICD-10-CM

## 2024-01-05 ENCOUNTER — Telehealth: Admitting: Family Medicine

## 2024-01-05 ENCOUNTER — Other Ambulatory Visit: Payer: Self-pay | Admitting: Family Medicine

## 2024-01-05 DIAGNOSIS — M19072 Primary osteoarthritis, left ankle and foot: Secondary | ICD-10-CM | POA: Diagnosis not present

## 2024-01-05 DIAGNOSIS — B002 Herpesviral gingivostomatitis and pharyngotonsillitis: Secondary | ICD-10-CM

## 2024-01-05 DIAGNOSIS — E66811 Obesity, class 1: Secondary | ICD-10-CM | POA: Diagnosis not present

## 2024-01-05 DIAGNOSIS — F32A Depression, unspecified: Secondary | ICD-10-CM

## 2024-01-05 DIAGNOSIS — Z6834 Body mass index (BMI) 34.0-34.9, adult: Secondary | ICD-10-CM

## 2024-01-05 DIAGNOSIS — E662 Morbid (severe) obesity with alveolar hypoventilation: Secondary | ICD-10-CM

## 2024-01-05 DIAGNOSIS — F419 Anxiety disorder, unspecified: Secondary | ICD-10-CM | POA: Diagnosis not present

## 2024-01-05 MED ORDER — ZEPBOUND 2.5 MG/0.5ML ~~LOC~~ SOAJ: 2.5000 mg | SUBCUTANEOUS | 2 refills | Status: DC

## 2024-01-05 MED ORDER — VALACYCLOVIR HCL 500 MG PO TABS: 500.0000 mg | ORAL_TABLET | Freq: Every day | ORAL | 1 refills | Status: AC

## 2024-01-05 MED ORDER — DICLOFENAC SODIUM 75 MG PO TBEC: 75.0000 mg | DELAYED_RELEASE_TABLET | Freq: Two times a day (BID) | ORAL | 2 refills | Status: AC | PRN

## 2024-01-05 MED ORDER — SERTRALINE HCL 100 MG PO TABS: 100.0000 mg | ORAL_TABLET | Freq: Every day | ORAL | 1 refills | Status: AC

## 2024-01-05 NOTE — Progress Notes (Signed)
 Primary Care / Sports Medicine Virtual Visit  Patient Information:  Patient ID: Steve Simmons, male DOB: [birth year] Age: 38 y.o. MRN: [id number]   Steve Simmons is a pleasant 38 y.o. male presenting with the following:  No chief complaint on file.   Review of Systems: No fevers, chills, night sweats, weight loss, chest pain, or shortness of breath.   Patient Active Problem List   Diagnosis Date Noted   Class 1 obesity with alveolar hypoventilation, serious comorbidity, and body mass index (BMI) of 34.0 to 34.9 in adult [facility]) 01/05/2024   Arthralgia of ankle 07/27/2023   Non-restorative sleep 03/27/2023   Sprain of calcaneofibular ligament 01/12/2023   Sprain of left ankle 01/12/2023   Primary osteoarthritis, unspecified ankle and foot 12/11/2022   Onychomycosis 04/28/2022   Anxiety and depression 02/08/2021   Arthralgia of both knees 10/16/2020   Arthralgia of left acromioclavicular joint 10/13/2020   Annual physical exam 10/13/2020   Localized osteoarthritis of ankle, left 10/13/2020   Recurrent oral herpes simplex 10/31/2016   BMI 31.0-31.9,adult 10/31/2016   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    tirzepatide  (ZEPBOUND ) 2.5 MG/0.5ML Pen Inject 2.5 mg into the skin once a week.    diclofenac  (VOLTAREN ) 75 MG EC tablet Take 1 tablet (75 mg total) by mouth 2 (two) times daily as needed.    hydrocortisone  2.5 % cream Apply a thin layer to the affected area twice daily for 1-2 weeks, then taper off gradually. 03/27/2023: PRN   methylPREDNISolone  (MEDROL  DOSEPAK) 4 MG TBPK tablet Take as directed    Multiple Vitamin (MULTIVITAMIN) tablet Take 1 tablet by mouth daily.    valACYclovir  (VALTREX ) 500 MG tablet Take 1 tablet (500 mg total) by mouth daily.     [DISCONTINUED] diclofenac  (VOLTAREN ) 75 MG EC tablet TAKE 1 TABLET BY MOUTH TWICE A DAY AS NEEDED    [DISCONTINUED] meloxicam  (MOBIC ) 15 MG tablet TAKE 1 TABLET (15 MG TOTAL) BY MOUTH DAILY.    [DISCONTINUED] sertraline  (ZOLOFT ) 50 MG tablet TAKE 1 TABLET BY MOUTH EVERY DAY    [DISCONTINUED] valACYclovir  (VALTREX ) 500 MG tablet TAKE 1 TABLET (500 MG TOTAL) BY MOUTH DAILY.    No facility-administered encounter medications on file as of 01/05/2024.   Past Surgical History:  Procedure Laterality Date   PSEUDOANEURYSM REPAIR Right    temporal artery   WISDOM TOOTH EXTRACTION Bilateral     Discussed the use of AI scribe software for clinical note transcription with the patient, who gave verbal consent to proceed.   Virtual Visit via MyChart Video:   I connected with Steve Simmons on 01/05/24 via MyChart Video and verified that I am speaking with the correct person using appropriate identifiers.   The limitations, risks, security and privacy concerns of performing an evaluation and management service by MyChart Video, including the higher likelihood of inaccurate diagnoses and treatments, and the availability of in person appointments were reviewed. The possible need of an additional face-to-face encounter for complete and high quality delivery of care was discussed. The patient was also made aware that there may be a patient responsible charge related to this service. The patient expressed understanding and wishes to proceed.  Provider location is in medical facility. Patient location is  at their home, different from provider location. People involved in care of the patient during this telehealth encounter were myself, my nurse/medical assistant, and my front office/scheduling team member.  Objective findings:   General: Speaking full sentences, no audible heavy breathing. Sounds alert and appropriately interactive. Well-appearing. Face symmetric. Extraocular movements intact. Pupils equal and  round. No nasal flaring or accessory muscle use visualized.  Independent interpretation of notes and tests performed by another provider:   None  Pertinent History, Exam, Impression, and Recommendations:   History of Present Illness Steve Simmons is a 38 year old male who presents with ongoing left ankle pain and upcoming subtalar fusion surgery.  Left ankle pain and functional limitation - Persistent significant left ankle pain despite prior consultations and treatments. - Initial evaluation at [facility] with MRI performed. - Consulted orthopedic surgeon in [place], who estimated a 50% chance of surgical benefit; patient found this unsatisfactory. - Subsequent evaluation at [facility]; scheduled for subtalar fusion surgery on January 5th. - Running causes left foot or ankle numbness after a quarter mile, limiting activity. - Currently taking diclofenac  with minimal pain relief. - Eager to return to running following surgery.  Sleep disturbance and obstructive sleep apnea - Diagnosed with mild obstructive sleep apnea (AHI 6.6, 4-5 events per night). - Uses CPAP machine, resulting in significant improvement in sleep quality and energy levels. - Continues to experience daily fatigue despite CPAP therapy.  Weight gain and physical activity - Weight gain attributed to reduced physical activity due to ankle pain and possible muscle gain from working out three times a week. - Current weight is 235 pounds, higher than college football weight. - Interested in weight loss options to aid ankle recovery.  Mood disturbance and anxiety symptoms - Daily symptoms of feeling down or irritable, trouble concentrating, and low energy. - Mood symptoms and low energy are somewhat difficult to manage in daily life. - Daily anxiety symptoms including nervousness, excessive worry, and difficulty relaxing. - Anxiety symptoms are somewhat difficult to  manage.  Results DIAGNOSTIC Sleep study: Apnea-Hypopnea Index (AHI) 6.6, indicating mild obstructive sleep apnea.  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 Scheduled for subtalar fusion surgery to alleviate pain by fusing bones and preventing scar tissue irritation. Discussed concerns about limited side-to-side motion post-surgery affecting running. - Proceed with subtalar fusion surgery on January 5th. - Contact Dr. Tobie to discuss post-surgery running expectations and potential need for physical therapy. - Continue diclofenac  for pain management until surgery.  Obstructive sleep apnea, mild Mild obstructive sleep apnea with AHI of 6.6. CPAP therapy improved sleep quality and well-being. - Continue CPAP therapy.  Depression and generalized anxiety disorder Ongoing symptoms with increased stress. Recent GAD-7 score of 12 indicates elevated anxiety. Symptoms exacerbated by life stressors. - Increased sertraline  to 100 mg daily. - Scheduled follow-up in four months to assess response to medication adjustment.  Weight Weight gain possibly due to reduced activity from ankle pain. Interest in weight loss to aid recovery and health improvement. Discussed potential use of Zepbound , insurance coverage uncertain due to mild sleep apnea. - Sent prescription for Zepbound  to pharmacy and checked insurance coverage. - Consider alternative options for obtaining Zepbound  if not covered by insurance, such as direct purchase from pharmaceutical company.  Problem List Items Addressed This Visit     Anxiety and depression - Primary   Relevant Medications   sertraline  (ZOLOFT )  100 MG tablet   Class 1 obesity with alveolar hypoventilation, serious comorbidity, and body mass index (BMI) of 34.0 to 34.9 in adult [facility])   Relevant Medications   tirzepatide  (ZEPBOUND ) 2.5 MG/0.5ML Pen   Localized osteoarthritis of ankle, left    Relevant Medications   diclofenac  (VOLTAREN ) 75 MG EC tablet   Recurrent oral herpes simplex   Relevant Medications   valACYclovir  (VALTREX ) 500 MG tablet     Orders & Medications Medications:  Meds ordered this encounter  Medications   sertraline  (ZOLOFT ) 100 MG tablet    Sig: Take 1 tablet (100 mg total) by mouth daily.    Dispense:  90 tablet    Refill:  1   diclofenac  (VOLTAREN ) 75 MG EC tablet    Sig: Take 1 tablet (75 mg total) by mouth 2 (two) times daily as needed.    Dispense:  60 tablet    Refill:  2   valACYclovir  (VALTREX ) 500 MG tablet    Sig: Take 1 tablet (500 mg total) by mouth daily.    Dispense:  90 tablet    Refill:  1   tirzepatide  (ZEPBOUND ) 2.5 MG/0.5ML Pen    Sig: Inject 2.5 mg into the skin once a week.    Dispense:  2 mL    Refill:  2   No orders of the defined types were placed in this encounter.    I discussed the above assessment and treatment plan with the patient. The patient was provided an opportunity to ask questions and all were answered. The patient agreed with the plan and demonstrated an understanding of the instructions.   The patient was advised to call back or seek an in-person evaluation if the symptoms worsen or if the condition fails to improve as anticipated.   I provided a total time of 30 minutes including both face-to-face and non-face-to-face time on 01/05/2024 inclusive of time utilized for medical chart review, information gathering, care coordination with staff, and documentation completion.    Selinda JINNY Ku, MD, [facility]   Primary Care Sports Medicine Primary Care and Sports Medicine at [facility]

## 2024-01-05 NOTE — Patient Instructions (Signed)
 VISIT SUMMARY:  During your visit, we discussed your ongoing left ankle pain and upcoming subtalar fusion surgery, sleep apnea management, mood and anxiety symptoms, and weight management.  YOUR PLAN:  PRIMARY OSTEOARTHRITIS OF THE LEFT ANKLE AND FOOT: You have chronic pain and numbness in your left ankle, which is worsened by running. You are scheduled for subtalar fusion surgery to help alleviate this pain. -Proceed with subtalar fusion surgery on January 5th. -Contact Dr. Tobie to discuss post-surgery running expectations and potential need for physical therapy. -Continue taking diclofenac  for pain management until surgery.  OBSTRUCTIVE SLEEP APNEA, MILD: You have mild obstructive sleep apnea, which has been improving with CPAP therapy. -Continue using your CPAP machine.  DEPRESSION AND GENERALIZED ANXIETY DISORDER: You are experiencing ongoing symptoms of depression and anxiety, which have been exacerbated by life stressors. -Increase sertraline  to 100 mg daily. -Follow up in four months to assess your response to the medication adjustment.  WEIGHT: You have gained weight, possibly due to reduced activity from your ankle pain. You are interested in weight loss to aid your recovery and overall health. -A prescription for Zepbound  has been sent to the pharmacy, and we are checking your insurance coverage. -Consider alternative options for obtaining Zepbound  if it is not covered by insurance, such as direct purchase from the pharmaceutical company.

## 2024-01-10 ENCOUNTER — Encounter: Payer: Self-pay | Admitting: Podiatry

## 2024-01-10 ENCOUNTER — Encounter: Payer: Self-pay | Admitting: Family Medicine

## 2024-01-10 ENCOUNTER — Other Ambulatory Visit: Payer: Self-pay

## 2024-01-10 DIAGNOSIS — E662 Morbid (severe) obesity with alveolar hypoventilation: Secondary | ICD-10-CM

## 2024-01-10 MED ORDER — ZEPBOUND 2.5 MG/0.5ML ~~LOC~~ SOAJ: 2.5000 mg | SUBCUTANEOUS | 2 refills | Status: DC

## 2024-01-11 ENCOUNTER — Encounter: Payer: Self-pay | Admitting: Podiatry

## 2024-01-11 NOTE — Telephone Encounter (Signed)
 Faxed updated form with DOS to Jessica [phone] DOS 02/05/24 Approx RTW 03/25/24

## 2024-01-17 ENCOUNTER — Other Ambulatory Visit: Payer: Self-pay | Admitting: Family Medicine

## 2024-01-17 DIAGNOSIS — F419 Anxiety disorder, unspecified: Secondary | ICD-10-CM

## 2024-01-18 NOTE — Telephone Encounter (Signed)
 Per MyChart pt sent [employer] forms. I sent mess via MyChart 25 fee must be paid prior to them being completed. He mentioned another set maybe coming as well. I adv they would be 25

## 2024-01-19 NOTE — Telephone Encounter (Signed)
 Discontinued 01/05/24, dose change.  Requested Prescriptions  Pending Prescriptions Disp Refills   sertraline  (ZOLOFT ) 50 MG tablet [Pharmacy Med Name: SERTRALINE  HCL 50 MG TABLET] 30 tablet 0    Sig: TAKE 1 TABLET BY MOUTH EVERY DAY     Psychiatry:  Antidepressants - SSRI - sertraline  Failed - 01/19/2024  1:51 PM      Failed - AST in normal range and within 360 days    AST  Date Value Ref Range Status  05/04/2022 38 0 - 40 IU/L Final         Failed - ALT in normal range and within 360 days    ALT  Date Value Ref Range Status  05/04/2022 36 0 - 44 IU/L Final         Passed - Completed PHQ-2 or PHQ-9 in the last 360 days      Passed - Valid encounter within last 6 months    Recent Outpatient Visits           2 weeks ago Anxiety and depression   [facility] Steve Simmons, Steve PARAS, MD   9 months ago Localized osteoarthritis of ankle, left   [facility] at [facility], Steve PARAS, MD

## 2024-01-23 DIAGNOSIS — Z0279 Encounter for issue of other medical certificate: Secondary | ICD-10-CM

## 2024-01-23 DIAGNOSIS — I10 Essential (primary) hypertension: Secondary | ICD-10-CM | POA: Diagnosis not present

## 2024-01-23 DIAGNOSIS — G4733 Obstructive sleep apnea (adult) (pediatric): Secondary | ICD-10-CM | POA: Diagnosis not present

## 2024-01-23 NOTE — Telephone Encounter (Signed)
 Faxed [employer] [phone] form/notes  NCARNG was faxed 01/11/24 Will email both sets to pt to have for his records He paid 50 for both forms

## 2024-01-29 DIAGNOSIS — Z0279 Encounter for issue of other medical certificate: Secondary | ICD-10-CM

## 2024-01-30 ENCOUNTER — Telehealth: Payer: Self-pay | Admitting: Podiatry

## 2024-01-30 NOTE — Telephone Encounter (Addendum)
 DOS- 02/05/2024   SUBTALAR ARTHRODESIS LT- 71274   BCBS EFFECTIVE DATE- 01/30/2024   PER REP WITH [employer] [place], PRIOR AUTH IS NOT REQUIRED FOR CPT CODE 71274. REF# [id number]  *PER PT, INS WILL STAY THE SAME WITH CURRENT ID # AND COVERAGE FOR 2026*

## 2024-01-30 NOTE — Telephone Encounter (Deleted)
 DOS- 02/05/2024  SUBTALAR ARTHRODESIS LT- 71274  BCBS EFFECTIVE DATE- 01/30/2024  PER REP WITH [employer] [place], PRIOR AUTH IS NOT REQUIRED FOR CPT CODE 71274. REF# [id number]

## 2024-02-05 ENCOUNTER — Other Ambulatory Visit: Payer: Self-pay | Admitting: Podiatry

## 2024-02-05 DIAGNOSIS — M19072 Primary osteoarthritis, left ankle and foot: Secondary | ICD-10-CM | POA: Diagnosis not present

## 2024-02-05 MED ORDER — OXYCODONE-ACETAMINOPHEN 5-325 MG PO TABS: 1.0000 | ORAL_TABLET | ORAL | 0 refills | Status: AC | PRN

## 2024-02-05 MED ORDER — IBUPROFEN 800 MG PO TABS: 800.0000 mg | ORAL_TABLET | Freq: Four times a day (QID) | ORAL | 1 refills | Status: AC | PRN

## 2024-02-06 ENCOUNTER — Encounter: Payer: Self-pay | Admitting: Podiatry

## 2024-02-13 ENCOUNTER — Ambulatory Visit (INDEPENDENT_AMBULATORY_CARE_PROVIDER_SITE_OTHER)

## 2024-02-13 ENCOUNTER — Ambulatory Visit (INDEPENDENT_AMBULATORY_CARE_PROVIDER_SITE_OTHER): Admitting: Podiatry

## 2024-02-13 DIAGNOSIS — M19072 Primary osteoarthritis, left ankle and foot: Secondary | ICD-10-CM

## 2024-02-13 DIAGNOSIS — Z9889 Other specified postprocedural states: Secondary | ICD-10-CM

## 2024-02-13 NOTE — Progress Notes (Signed)
"  °  Subjective:  Patient ID: Steve Simmons, male    DOB: January 30, 1986,  MRN: [id number]  Chief Complaint  Patient presents with   Routine Post Op    POV #1 DOS 02/05/2024 LT SUBTALAR ARTHRODESIS    DOS: 02/05/2024 Procedure: Left subtalar joint arthrodesis  39 y.o. male returns for post-op check.  He states is doing well no pain pain is well-controlled.  Nonweightbearing to the left lower extremity with Cam boot and crutches bandages clean dry and intact  Review of Systems: Negative except as noted in the HPI. Denies N/V/F/Ch.  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 There is no height or weight on file to calculate BMI. Constitutional Well developed. Well nourished.  Vascular Foot warm and well perfused. Capillary refill normal to all digits.   Neurologic Normal speech. Oriented to person, place, and time. Epicritic sensation to light touch grossly present bilaterally.  Dermatologic Skin healing well without signs of infection. Skin edges well coapted without signs of infection.  Orthopedic: Tenderness to palpation noted about the surgical site.   Radiographs: 3 views of skeletally mature adult left foot: Hardware is intact no signs of backing out or loosening noted.  Good correction alignment noted.  Reduction of subtalar joint noted Assessment:   1. Arthritis of left subtalar joint   2. Status post foot surgery    Plan:  Patient was evaluated and treated and all questions answered.  S/p foot surgery left -Progressing as expected post-operatively. -XR: See above -WB Status: Nonweightbearing in left lower extremity and crutches -Sutures: Intact.  No clinical signs of dehiscence noted no complication noted. -Medications: None -Foot redressed.  No follow-ups on file.      [1]  Current  Outpatient Medications:    diclofenac  (VOLTAREN ) 75 MG EC tablet, Take 1 tablet (75 mg total) by mouth 2 (two) times daily as needed., Disp: 60 tablet, Rfl: 2   hydrocortisone  2.5 % cream, Apply a thin layer to the affected area twice daily for 1-2 weeks, then taper off gradually., Disp: 3.5 g, Rfl: 1   ibuprofen  (ADVIL ) 800 MG tablet, Take 1 tablet (800 mg total) by mouth every 6 (six) hours as needed., Disp: 60 tablet, Rfl: 1   methylPREDNISolone  (MEDROL  DOSEPAK) 4 MG TBPK tablet, Take as directed, Disp: 21 each, Rfl: 0   Multiple Vitamin (MULTIVITAMIN) tablet, Take 1 tablet by mouth daily., Disp: , Rfl:    oxyCODONE -acetaminophen  (PERCOCET) 5-325 MG tablet, Take 1 tablet by mouth every 4 (four) hours as needed for severe pain (pain score 7-10)., Disp: 30 tablet, Rfl: 0   sertraline  (ZOLOFT ) 100 MG tablet, Take 1 tablet (100 mg total) by mouth daily., Disp: 90 tablet, Rfl: 1   tirzepatide  (ZEPBOUND ) 2.5 MG/0.5ML Pen, Inject 2.5 mg into the skin once a week., Disp: 2 mL, Rfl: 2   valACYclovir  (VALTREX ) 500 MG tablet, Take 1 tablet (500 mg total) by mouth daily., Disp: 90 tablet, Rfl: 1 [2]  Social History Tobacco Use  Smoking Status Former   Types: Cigars  Smokeless Tobacco Former   Types: Chew   Quit date: 03/04/2023  Tobacco Comments   I have used chewing tobacco/snuff off and on for 10 years.  [3] No Known Allergies  "

## 2024-02-13 NOTE — Telephone Encounter (Signed)
 Faxed [employer] [phone] forms/note RTW 03/25/24 DOS 02/05/24

## 2024-02-27 ENCOUNTER — Encounter: Admitting: Podiatry

## 2024-02-29 ENCOUNTER — Encounter: Payer: Self-pay | Admitting: Podiatry

## 2024-02-29 ENCOUNTER — Ambulatory Visit: Admitting: Podiatry

## 2024-02-29 DIAGNOSIS — M19072 Primary osteoarthritis, left ankle and foot: Secondary | ICD-10-CM

## 2024-02-29 DIAGNOSIS — Z9889 Other specified postprocedural states: Secondary | ICD-10-CM

## 2024-02-29 NOTE — Progress Notes (Signed)
 "  Subjective:  Patient ID: Steve Simmons, male    DOB: Jul 09, 1985,  MRN: [id number]  Chief Complaint  Patient presents with   Routine Post Op    POV #2 DOS 02/05/2024 LT SUBTALAR ARTHRODESIS Pt stated that he is doing well he denies any pain at this time no concerns     DOS: 02/05/2024 Procedure: Left subtalar joint arthrodesis  39 y.o. male returns for post-op check.  He states is doing well no pain pain is well-controlled.  He has been nonweightbearing with crutches.  He is doing okay.  Denies any other acute complaints.  He is currently not experiencing any pain.  Review of Systems: Negative except as noted in the HPI. Denies N/V/F/Ch.  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 There is no height or weight on file to calculate BMI. Constitutional Well developed. Well nourished.  Vascular Foot warm and well perfused. Capillary refill normal to all digits.   Neurologic Normal speech. Oriented to person, place, and time. Epicritic sensation to light touch grossly present bilaterally.  Dermatologic Skin incision well-healed.  No superficial dehiscence noted no complication noted.  Good correction alignment noted of the arthrodesis  Orthopedic: No further tenderness to palpation noted about the surgical site.   Radiographs: 3 views of skeletally mature adult left foot: Hardware is intact no signs of backing out or loosening noted.  Good correction alignment noted.  Reduction of subtalar joint noted Assessment:   No diagnosis found.  Plan:  Patient was evaluated and treated and all questions answered.  S/p foot surgery left -Progressing as expected post-operatively. -XR: See above -WB Status: Only begin weightbearing as tolerated in left lower extremity with Cam boot on -Sutures: Removed.   Steri-Strips applied no clinical signs of dehiscence noted no complication noted. -Medications: None - I will extend out disability for another 4 weeks. - Prescription for physical therapy was given.  No follow-ups on file.      [1]  Current Outpatient Medications:    diclofenac  (VOLTAREN ) 75 MG EC tablet, Take 1 tablet (75 mg total) by mouth 2 (two) times daily as needed., Disp: 60 tablet, Rfl: 2   hydrocortisone  2.5 % cream, Apply a thin layer to the affected area twice daily for 1-2 weeks, then taper off gradually., Disp: 3.5 g, Rfl: 1   ibuprofen  (ADVIL ) 800 MG tablet, Take 1 tablet (800 mg total) by mouth every 6 (six) hours as needed., Disp: 60 tablet, Rfl: 1   methylPREDNISolone  (MEDROL  DOSEPAK) 4 MG TBPK tablet, Take as directed, Disp: 21 each, Rfl: 0   Multiple Vitamin (MULTIVITAMIN) tablet, Take 1 tablet by mouth daily., Disp: , Rfl:    oxyCODONE -acetaminophen  (PERCOCET) 5-325 MG tablet, Take 1 tablet by mouth every 4 (four) hours as needed for severe pain (pain score 7-10)., Disp: 30 tablet, Rfl: 0   sertraline  (ZOLOFT ) 100 MG tablet, Take 1 tablet (100 mg total) by mouth daily., Disp: 90 tablet, Rfl: 1   tirzepatide  (ZEPBOUND ) 2.5 MG/0.5ML Pen, Inject 2.5 mg into the skin once a week., Disp: 2 mL, Rfl: 2   valACYclovir  (VALTREX ) 500 MG tablet, Take 1 tablet (500 mg total) by mouth daily., Disp: 90 tablet, Rfl: 1 [2]  Social History Tobacco Use  Smoking Status Former   Types: Cigars  Smokeless Tobacco Former  Types: [place]   Quit date: 03/04/2023  Tobacco Comments   I have used chewing tobacco/snuff off and on for 10 years.  [3] No Known Allergies  "

## 2024-02-29 NOTE — Telephone Encounter (Signed)
 Per Dr. Tobie his revised RTW is now 04/29/24. I faxed [employer] [phone] and emailed pt copy of note.

## 2024-03-01 NOTE — Telephone Encounter (Signed)
 Steve Simmons [phone] form/notes for pt Revised RTW 04/29/24. Emailed pt for his records

## 2024-03-05 ENCOUNTER — Encounter: Payer: Self-pay | Admitting: Family Medicine

## 2024-03-05 ENCOUNTER — Other Ambulatory Visit: Payer: Self-pay

## 2024-03-05 DIAGNOSIS — E662 Morbid (severe) obesity with alveolar hypoventilation: Secondary | ICD-10-CM

## 2024-03-05 MED ORDER — ZEPBOUND 5 MG/0.5ML ~~LOC~~ SOAJ: 5.0000 mg | SUBCUTANEOUS | 1 refills | Status: DC

## 2024-03-06 ENCOUNTER — Telehealth: Payer: Self-pay

## 2024-03-06 ENCOUNTER — Other Ambulatory Visit: Payer: Self-pay

## 2024-03-06 DIAGNOSIS — E662 Morbid (severe) obesity with alveolar hypoventilation: Secondary | ICD-10-CM

## 2024-03-06 MED ORDER — ZEPBOUND 5 MG/0.5ML ~~LOC~~ SOAJ: 5.0000 mg | SUBCUTANEOUS | 1 refills | Status: AC

## 2024-03-06 NOTE — Telephone Encounter (Signed)
 Copied from CRM [id number]. Topic: Clinical - Prescription Issue >> Mar 06, 2024  1:35 PM Joesph NOVAK wrote: Reason for CRM: [facility] is calling about zep bound prescription. States they only offer vials, not the pens. Wants to know if pcp is ok with that?     PH:[phone]

## 2024-03-21 ENCOUNTER — Encounter: Admitting: Podiatry

## 2024-05-01 ENCOUNTER — Encounter: Admitting: Family Medicine
